# Patient Record
Sex: Female | Born: 1965 | Race: White | Hispanic: No | Marital: Married | Smoking: Current every day smoker
Health system: Southern US, Community
[De-identification: ages and names within clinical notes are randomized; demographics above are authoritative.]

---

## 1999-04-08 ENCOUNTER — Other Ambulatory Visit: Admission: RE | Admit: 1999-04-08 | Discharge: 1999-04-08 | Payer: Self-pay | Admitting: Obstetrics and Gynecology

## 2000-06-02 ENCOUNTER — Other Ambulatory Visit: Admission: RE | Admit: 2000-06-02 | Discharge: 2000-06-02 | Payer: Self-pay | Admitting: Obstetrics and Gynecology

## 2001-07-08 ENCOUNTER — Other Ambulatory Visit: Admission: RE | Admit: 2001-07-08 | Discharge: 2001-07-08 | Payer: Self-pay | Admitting: Obstetrics and Gynecology

## 2005-08-28 ENCOUNTER — Other Ambulatory Visit: Admission: RE | Admit: 2005-08-28 | Discharge: 2005-08-28 | Payer: Self-pay | Admitting: Obstetrics and Gynecology

## 2006-03-06 ENCOUNTER — Inpatient Hospital Stay (HOSPITAL_COMMUNITY): Admission: AD | Admit: 2006-03-06 | Discharge: 2006-03-08 | Payer: Self-pay | Admitting: Obstetrics and Gynecology

## 2006-05-18 ENCOUNTER — Encounter: Admission: RE | Admit: 2006-05-18 | Discharge: 2006-05-18 | Payer: Self-pay | Admitting: Obstetrics and Gynecology

## 2008-08-22 ENCOUNTER — Ambulatory Visit (HOSPITAL_COMMUNITY): Admission: RE | Admit: 2008-08-22 | Discharge: 2008-08-22 | Payer: Self-pay | Admitting: Obstetrics and Gynecology

## 2008-08-22 ENCOUNTER — Encounter (INDEPENDENT_AMBULATORY_CARE_PROVIDER_SITE_OTHER): Payer: Self-pay | Admitting: Obstetrics and Gynecology

## 2011-04-01 NOTE — Op Note (Signed)
Penny Hall, Penny Hall NO.:  [id number]   MEDICAL RECORD NO.:  [id number]          PATIENT TYPE:  AMB   LOCATION:  SDC                           FACILITY:  [facility]   PHYSICIAN:  Juluis Mire, M.D.   DATE OF BIRTH:  04-21-1966   DATE OF PROCEDURE:  08/22/2008  DATE OF DISCHARGE:                               OPERATIVE REPORT   PREOPERATIVE DIAGNOSES:  1. Multiparity, desires sterility.  2. Intrauterine contraceptive device in place.  3. Endometrial cells on Pap smear.   POSTOPERATIVE DIAGNOSES:  1. Multiparity, desires sterility.  2. Intrauterine contraceptive device in place.  3. Endometrial cells on Pap smear.  4. Abdominal omental adhesions.   OPERATIVE PROCEDURE:  1. Removal of IUD.  2. Hysteroscopy with endometrial curettings.  3. Laparoscopy with bilateral tubal ligation and lysis of adhesions.   SURGEON:  Juluis Mire, MD   ANESTHESIA:  General endotracheal.   ESTIMATED BLOOD LOSS:  Minimal.   PACKS AND DRAINS:  None.   INTRAOPERATIVE BLOOD PLACED:  None.   COMPLICATIONS:  None.   INDICATIONS:  As dictated in history and physical.   PROCEDURE IN DETAIL:  The patient was taken to the OR and placed in  supine position.  After satisfactory level of general endotracheal  anesthesia obtained, the patient was placed in dorsal lithotomy position  using Allen stirrups.  The abdomen, perineum, and vagina were prepped  out with Betadine and draped out for hysteroscopy.  A speculum was  placed in the vaginal vault.  The cervix was grasped with single-tooth  tenaculum.  Approximately 10 mL of 1% Nesacaine was instilled into the  cervix for paracervical block.  Using the Randall stone forceps, the  __________ was retrieved intact.  Uterus sounded to 8 cm.  Cervix  serially dilated to size 27 Pratt dilator.  The nonoperative  hysteroscope was introduced.  Intrauterine cavity was distended using  sorbitol.  Visualization revealed absolutely smooth  endometrium.  No  polyps or excrescences.  The hysteroscope was then removed.  Curettings  were obtained and sent for pathological review.  The Hulka tenaculum was  put in place and secured.  The single-tooth tenaculum and speculum were  then removed.  Bladder was emptied by in-and-out catheterization.   At this point in time, a subumbilical incision was made with a knife and  extended through the subcutaneous tissue.  The fascia was identified,  entered sharply, and incision fashioned laterally.  Peritoneum was  entered with blunt pressure.  There was no evidence of any periumbilical  adhesions.  The open laparoscopic trocar was put in place and secured.  The abdomen was insufflated with carbon dioxide.  The laparoscope was  then introduced.  The uterus was elevated.  Uterus, tubes, and ovaries  were unremarkable.  There was no evidence of any pelvic pathology.  Both  tubes were cauterized for a distance of 2.5 cm.  Coagulation was  continued until resistance rate 0 on the meter.  The same segment tube  was then recoagulated completely desiccating the tube.  Coagulation did  extend out to the mesosalpinx.  There was no evidence of injury  to  adjacent organs.  There were omental adhesions to the right lower  quadrant.  These were taken down using scissors and cautery.  I could  not see the appendix.  It may have been retrocecal.  There was no  evidence of any other abdominal pelvic pathology.  We had good  hemostasis.  Both tubes were adequately coagulated.  The abdomen was  deflated of carbon dioxide.  The trocar was removed.  Subumbilical  fascia closed with figure-of-eight of 0 Vicryl, skin with interrupted  subcuticular, then removed.  The patient was taken out of dorsal  lithotomy position.  Once alert and extubated, transferred to the  recovery in good condition.  Sponge, instrument, and needle count was  correct by circulating nurse x2.      Juluis Mire, M.D.   Electronically Signed     JSM/MEDQ  D:  08/22/2008  T:  08/22/2008  Job:  [id number]

## 2011-04-01 NOTE — H&P (Signed)
NAME:  Penny Hall, Penny Hall NO.:  [id number]   MEDICAL RECORD NO.:  [id number]          PATIENT TYPE:  AMB   LOCATION:  SDC                           [facility]ACILITY:  [facility]   PHYSICIAN:  Juluis Mire, M.D.   DATE OF BIRTH:  07-Jul-1966   DATE OF ADMISSION:  08/22/2008  DATE OF DISCHARGE:                              HISTORY & PHYSICAL   The patient is a 45 year old gravida 4, para 3, abortus 1 female  presents for laparoscopic bilateral tubal ligation.  She is also going  to have the IUD removed and a hysteroscopy performed.   In relation to present admission, the patient does have Mirena IUD in  place.  Periods are somewhat irregular, but late.  There is no pain or  discomfort; however, she is interested in permanent sterilization.  Alternatives for birth control obviously have been explained.  The  potential irreversibility of sterilization is explained.  The potential  failure rate of 1 in 2-300s, and quoted figures can be in the form of  ectopic pregnancy requiring further surgical management.   The patient's last Pap smear also revealed endometrial cells on the Pap  smear.  In view of her age, we cannot proceed with hysteroscopic  evaluation after the IUD is removed.   ALLERGIES:  She has no known drug allergies.   MEDICATIONS:  None.   PAST MEDICAL HISTORY:  Usual childhood diseases.  No significant  sequelae.  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   PAST SURGICAL HISTORY:  She had a deviated septum repaired at age 36.  She had an appendectomy at age 27.  She had a laparoscopy in 1996.  She  has had 2 previous cesarean section and 1 vaginal delivery.   SOCIAL HISTORY:  No tobacco or alcohol use.   FAMILY HISTORY:  Unknown as the patient is adopted.   REVIEW OF SYSTEMS:  Noncontributory.   PHYSICAL EXAMINATION:  GENERAL:  The patient is afebrile.  VITAL SIGNS:  Stable.  HEENT:  The patient is normocephalic.  Pupils  are equal, round, and  react to light and accommodation.  Extraocular movements were intact.  Sclerae and conjunctivae are clear.  Oropharynx clear.  NECK:  Without thyromegaly.  BREASTS:  Not examined.  LUNGS:  Clear.  CARDIOVASCULAR:  Regular rhythm rate.  No murmurs or gallops.  ABDOMEN:  Benign.  No mass, organomegaly, or tenderness.  PELVIC:  Normal external genitalia.  Vaginal mucosa clear.  Cervix  unremarkable.  IUD string is not noted.  Uterus normal size, shape, and  contour.  Adnexa free of masses or tenderness.  EXTREMITIES:  Trace edema.  NEUROLOGIC:  Grossly within normal limits.   IMPRESSION:  1. Multiparity, desires sterility.  2. Endometrial cells on Pap smear.  3. Mirena intrauterine device in place.   PLAN:  The patient to undergo removal of IUD as well as hysteroscopy.  The nature of the procedure have been discussed.  The risk would include  a risk of infection.  Risk of vascular injury could lead to hemorrhage  requiring transfusion with the risk of AIDS or  hepatitis.  Excessive  bleeding could require hysterectomy.  There is a risk of perforation  lead to injury to adjacent organs including bowel that could require  further exploratory surgery.  Also, risk of deep venous thrombosis and  pulmonary embolus.  From laparoscopy, the risk included the risk of  infection, risk of vascular injury that could require transfusions, risk  of injury to adjacent organs such as bladder or bowel that could require  further exploratory surgery.  The patient expressed understanding of  indications and risks and accepts none of them.      Juluis Mire, M.D.  Electronically Signed     JSM/MEDQ  D:  08/22/2008  T:  08/22/2008  Job:  [id number]

## 2011-04-04 NOTE — H&P (Signed)
NAME:  Penny Hall, Penny Hall NO.:  [id number]   MEDICAL RECORD NO.:  [id number]          PATIENT TYPE:  INP   LOCATION:                                FACILITY:  [facility]   PHYSICIAN:  Juluis Mire, M.D.   DATE OF BIRTH:  1966-05-20   DATE OF ADMISSION:  03/06/2006  DATE OF DISCHARGE:                                HISTORY & PHYSICAL   HISTORY OF PRESENT ILLNESS:  The patient is a 45 year old gravida 4, para 1-  1-1-2 married female whose estimated date of confinement is March 15, 2006,  giving her an estimated gestational age of [redacted] weeks.  She presents for  repeat cesarean section.   In relation to present admission, the patient's last pregnancy was a low  transverse cesarean section for failure to progress.  The patient declined a  trial of labor and presents for repeat cesarean section.   The patient was at risk for advanced maternal age.  We had discussed the  increased risk of genetic anomalies associated with advanced maternal age.  With her first pregnancy, her son did have Down's syndrome with preterm  delivery at 34 weeks.  The patient declined any type of genetic evaluation,  stating she would not terminate the pregnancy based on any findings.  Ultrasounds were basically unremarkable.   ALLERGIES:  The patient has no known drug allergies.   MEDICATIONS:  Prenatal vitamins.   PAST MEDICAL HISTORY:  Please see prenatal records.   FAMILY HISTORY:  Please see prenatal records.   SOCIAL HISTORY:  Please see prenatal records.   REVIEW OF SYSTEMS:  Noncontributory.   PHYSICAL EXAMINATION:  VITAL SIGNS:  The patient is afebrile with stable  vital signs.  HEENT:  The patient is normocephalic.  Pupils equal, round and reactive to  light and accommodation.  Extraocular movements were intact.  Sclerae and  conjunctivae were clear.  Oropharynx clear.  NECK:  Without thyromegaly.  BREASTS:  Glandular, but no discrete masses.  LUNGS:  Clear.  CARDIOVASCULAR:   Regular rate and rhythm with a grade 2/6 systolic ejection  murmur, no clicks or gallops.  ABDOMEN:  Gravid uterus consistent with dates.  PELVIC:  Exam deferred due to planned C-section.  EXTREMITIES:  Trace edema.  NEUROLOGIC:  Exam is grossly within normal limits.   IMPRESSION:  1.  Intrauterine pregnancy at term with prior cesarean section, desires      repeat.  2.  Advanced maternal age.  3.  Prior history of child with trisomy 22.   PLAN:  The patient will undergo repeat cesarean section.  The risks of the  procedure have been discussed including the risk of infection, the risk of  hemorrhage that could require transfusion with the risks of AIDS or  hepatitis, the risk of injury to adjacent organs including bladder, bowel or  ureters that would require further exploratory surgery, risks of deep venous  thrombosis and pulmonary embolus.  The patient expressed understanding of  indications and risks and was accepting of them.      Juluis Mire, M.D.  Electronically Signed  JSM/MEDQ  D:  03/06/2006  T:  03/06/2006  Job:  [id number]

## 2011-04-04 NOTE — Discharge Summary (Signed)
Penny Hall, Penny Hall        ACCOUNT NO.:  [id number]   MEDICAL RECORD NO.:  [id number]          PATIENT TYPE:  INP   LOCATION:  9139                          FACILITY:  [facility]   PHYSICIAN:  Michelle L. Grewal, M.D.DATE OF BIRTH:  1966-05-06   DATE OF ADMISSION:  03/06/2006  DATE OF DISCHARGE:  03/08/2006                                 DISCHARGE SUMMARY   ADMITTING DIAGNOSES:  1.  Intrauterine pregnancy at term.  2.  Previous cesarean section, desires repeat.   DISCHARGE DIAGNOSES:  1.  Status post low transverse cesarean section.  2.  Viable female infant.   PROCEDURE:  Repeat low transverse cesarean section.   REASON FOR ADMISSION:  Please see dictated H&P.   HOSPITAL COURSE:  The patient is a 45 year old gravida 4, para 2 white  married female, that was admitted to [facility] for  scheduled cesarean section.  The patient had a previous cesarean for failure  to progress and desired repeat.  On the morning of admission, the patient  was taken to the operating room, where spinal anesthesia was administered  without difficulty.  A low transverse incision was made, with delivery of a  viable female infant weighing 8 pounds 11 ounces and Apgars of 9 at 1 min and  9 at 5 min.  The patient tolerated the procedure well and was taken to the  recovery room in stable condition.   On postoperative day #1 the patient was without complaint.  She was  afebrile.  Vital signs were stable.  Abdomen soft, with good return of bowel  function.  Fundus was firm and nontender.  Abdominal dressing was found to  be clean, dry and intact.  Laboratory findings revealed hemoglobin of 9.7,  platelet count 314,000, WBC 9.2.   On postoperative day #2 the patient was without complaint.  She did desire  early discharge.  Vital signs were stable.  She was afebrile.  Abdomen soft.  Fundus firm and nontender.  Incision was clean, dry and intact.  Staples  were removed and the patient was  discharged home.   CONDITION ON DISCHARGE:  Good.   DIET:  Regular as tolerated.   ACTIVITY:  No heavy lifting, no driving x2 weeks.  No vaginal entry.   FOLLOW UP:  Patient to follow up in the office in 1-2 weeks for incision  check.  She is to call for temperature greater than 100 degrees, persistent  nausea or vomiting, heavy vaginal bleeding and/or redness or drainage from  the incisional site.   DISCHARGE MEDICATIONS:  1.  Percocet 5/325 mg (31) p.o. q.4-6 h. p.r.n.  2.  Motrin 600 mg q.6 h.  3.  Prenatal vitamins one p.o. daily.  4.  Colace one p.o. daily.      Julio Sicks, N.P.      Stann Mainland. Vincente Poli, M.D.  Electronically Signed    CC/MEDQ  D:  03/26/2006  T:  03/27/2006  Job:  [id number]

## 2011-04-04 NOTE — Op Note (Signed)
[name], Penny Hall NO.:  [id number]   MEDICAL RECORD NO.:  [id number]          PATIENT TYPE:  INP   LOCATION:  9139                          FACILITY:  [facility]   PHYSICIAN:  Juluis Mire, M.D.   DATE OF BIRTH:  [birth year]   DATE OF PROCEDURE:  03/06/2006  DATE OF DISCHARGE:                                 OPERATIVE REPORT   PREOPERATIVE DIAGNOSIS:  Intrauterine pregnancy at term with prior cesarean  section, __________.   POSTOPERATIVE DIAGNOSIS:  Intrauterine pregnancy at term with prior cesarean  section,__________.   OPERATIVE PROCEDURE:  Low transverse cesarean section.   SURGEON:  Juluis Mire, M.D.   ANESTHESIA:  Spinal.   ESTIMATED BLOOD LOSS:  500-600 cc.   PACKS/DRAINS:  None.   __________ .   COMPLICATIONS:  None.   INDICATIONS:  Dictated in the history and physical.   PROCEDURE:  Patient was taken to the OR.  Placed in a supine position with  left lateral tilt.  After a satisfactory level of spinal anesthesia was  obtained, the abdomen was prepped with Betadine and draped in a sterile  field.  The previous low transverse cesarean incision was identified and  excised.  The incision was extended through the subcutaneous tissue.  The  fascia was entered sharply, and __________ .  The fascia was taken off the  muscles superiorly and inferiorly.  Rectus muscles were separated in the  midline.  The peritoneum was entered sharply, and the incision was extended,  both superiorly and inferiorly.  A low transverse bladder flap was  developed.  A low transverse uterine incision was begun with the knife and  extended laterally using manual traction.  Amniotic fluid was clear.  The  infant presented with elevation of the head and fundal pressure.  The infant  was a viable female weighing 8 pounds, 11 ounces.  Apgars were 9/9.  There  were some large bleeding sinuses encountered during the uterine incision.  We exteriorized the uterus and brought these  under control with the ring  forceps.  The placenta was then delivered manually.  Tubes and ovaries were  unremarkable.  The uterus was closed with running, locking sutures of 0  chromic in a two-layered closure technique.  We had good hemostasis at this  point.  Urine output remained clear and adequate.  The uterus was returned  to the abdominal cavity.  Muscles were approximated with a  running suture of 3-0 Vicryl.  Fascia was closed with a running suture of 0  PDS.  The skin was closed with staple and Steri-Strips.  Sponge, needle, and  instrument count correct by the circulating nurse x2.  Urine output was  clear at the time of closure.  The patient tolerated the procedure well and  returned to the recovery room in good condition.      Juluis Mire, M.D.  Electronically Signed     JSM/MEDQ  D:  03/06/2006  T:  03/06/2006  Job:  [id number]

## 2011-08-18 LAB — CBC
HCT: 41.9
Hemoglobin: 13.6
MCHC: 32.5
MCV: 96.8
Platelets: 451 — ABNORMAL HIGH
RBC: 4.32
RDW: 12.6

## 2014-09-20 ENCOUNTER — Other Ambulatory Visit: Payer: Self-pay | Admitting: Family Medicine

## 2014-09-20 ENCOUNTER — Other Ambulatory Visit (HOSPITAL_COMMUNITY)
Admission: RE | Admit: 2014-09-20 | Discharge: 2014-09-20 | Disposition: A | Discharge status: Home / Self Care | Payer: Medicaid Other | Source: Ambulatory Visit | Attending: Family Medicine | Admitting: Family Medicine

## 2014-09-20 ENCOUNTER — Other Ambulatory Visit (HOSPITAL_COMMUNITY): Payer: Self-pay | Admitting: Family Medicine

## 2014-09-20 DIAGNOSIS — Z01419 Encounter for gynecological examination (general) (routine) without abnormal findings: Secondary | ICD-10-CM | POA: Insufficient documentation

## 2014-09-20 DIAGNOSIS — Z1231 Encounter for screening mammogram for malignant neoplasm of breast: Secondary | ICD-10-CM

## 2014-09-22 LAB — CYTOLOGY - PAP

## 2014-10-04 ENCOUNTER — Ambulatory Visit (HOSPITAL_COMMUNITY)
Admission: RE | Admit: 2014-10-04 | Discharge: 2014-10-04 | Disposition: A | Discharge status: Home / Self Care | Payer: Medicaid Other | Source: Ambulatory Visit | Attending: Family Medicine | Admitting: Family Medicine

## 2014-10-04 DIAGNOSIS — Z1231 Encounter for screening mammogram for malignant neoplasm of breast: Secondary | ICD-10-CM

## 2015-06-13 ENCOUNTER — Other Ambulatory Visit: Payer: Self-pay | Admitting: Family Medicine

## 2015-06-13 DIAGNOSIS — R1030 Lower abdominal pain, unspecified: Secondary | ICD-10-CM

## 2015-06-21 ENCOUNTER — Ambulatory Visit
Admission: RE | Admit: 2015-06-21 | Discharge: 2015-06-21 | Disposition: A | Discharge status: Home / Self Care | Payer: Medicaid Other | Source: Ambulatory Visit | Attending: Family Medicine | Admitting: Family Medicine

## 2015-06-21 DIAGNOSIS — R1030 Lower abdominal pain, unspecified: Secondary | ICD-10-CM

## 2015-06-21 IMAGING — US US PELVIS COMPLETE
1 series · 14 of 25 positions shown · non-contrast
Comparison: None

CLINICAL DATA: Right lower quadrant pain for 1 month. Dyspareunia.
Postmenopausal female.

EXAM:
TRANSABDOMINAL AND TRANSVAGINAL ULTRASOUND OF PELVIS
TECHNIQUE: Both transabdominal and transvaginal ultrasound examinations of the
pelvis were performed. Transabdominal technique was performed for
global imaging of the pelvis including uterus, ovaries, adnexal
regions, and pelvic cul-de-sac. It was necessary to proceed with
endovaginal exam following the transabdominal exam to visualize the
endometrial stripe and ovaries.

[Series 1: us pelvis complete · 0.28mm/px · 14 of 40 slices shown]
[im 1/40]
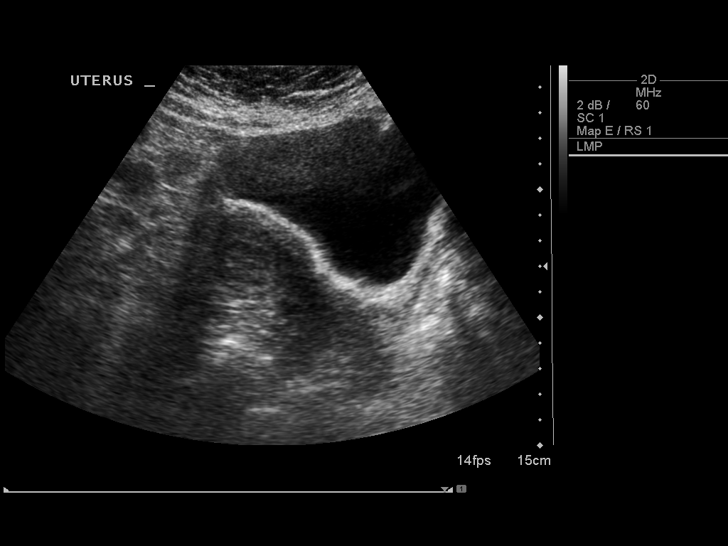
[im 4/40]
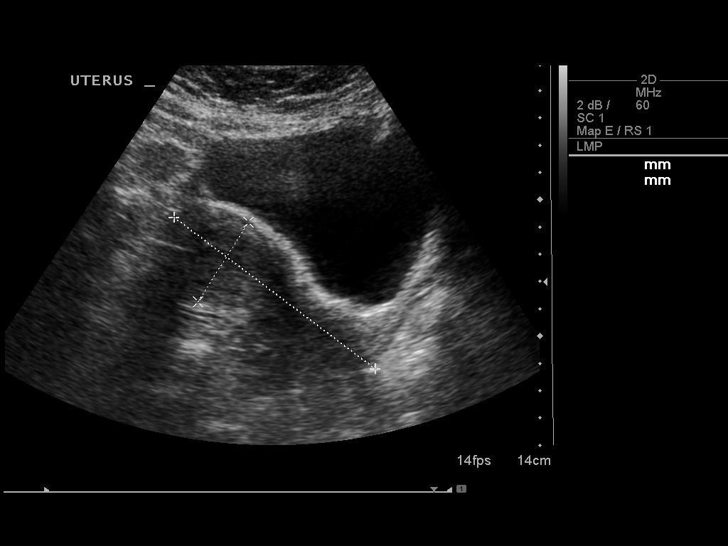
[im 7/40]
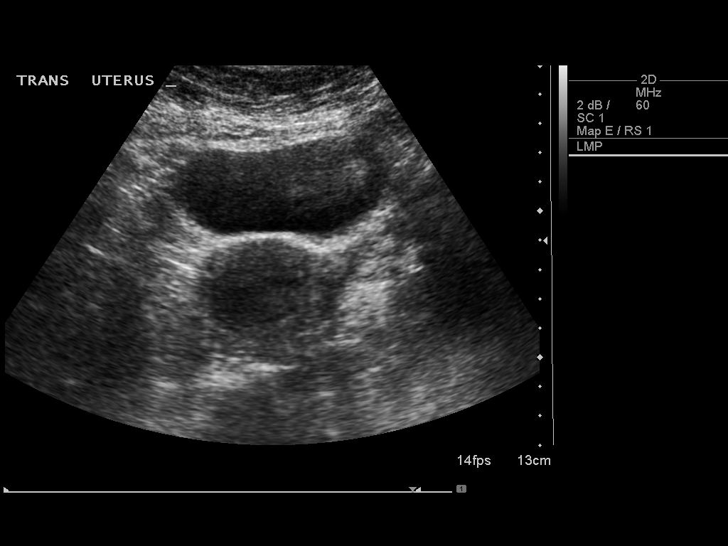
[im 10/40]
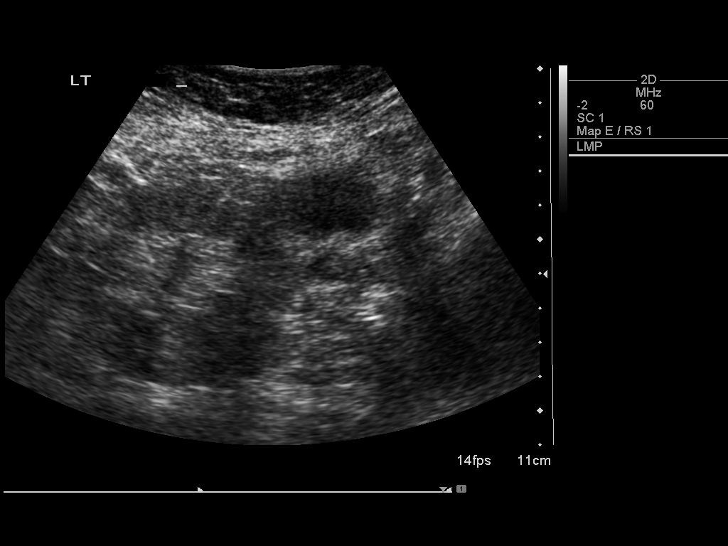
[im 14/40]
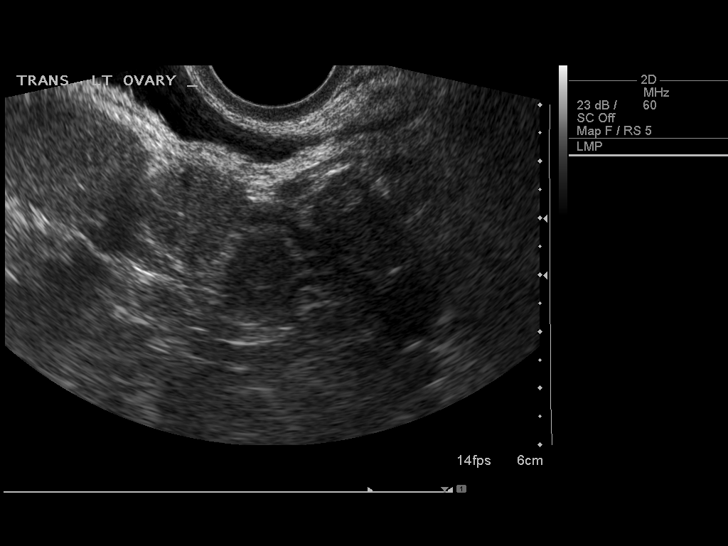
[im 15/40]
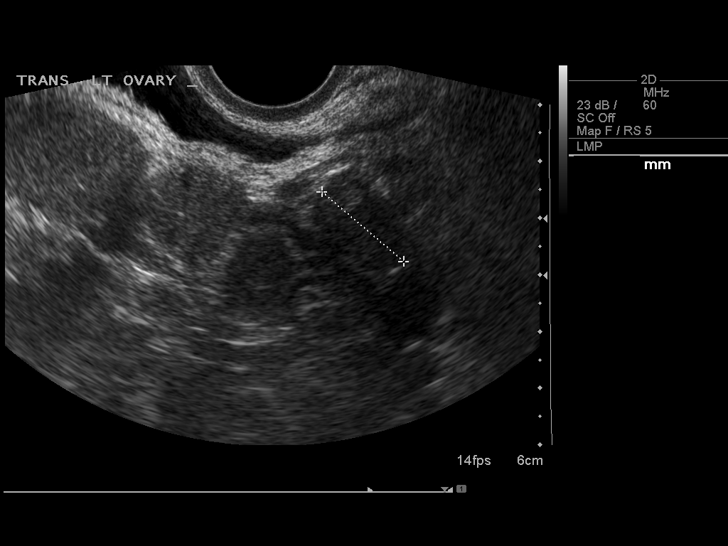
[im 18/40]
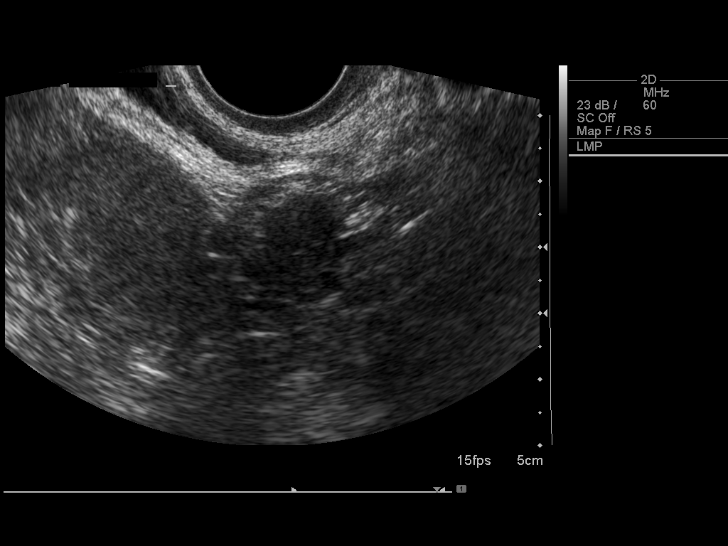
[im 22/40]
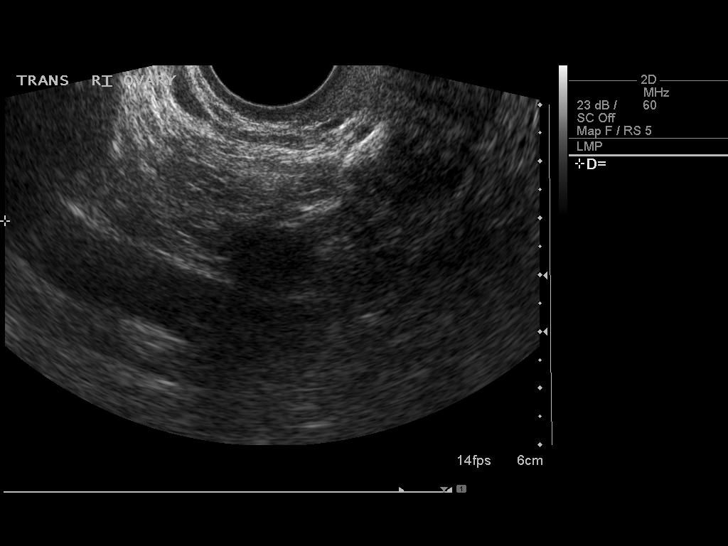
[im 25/40]
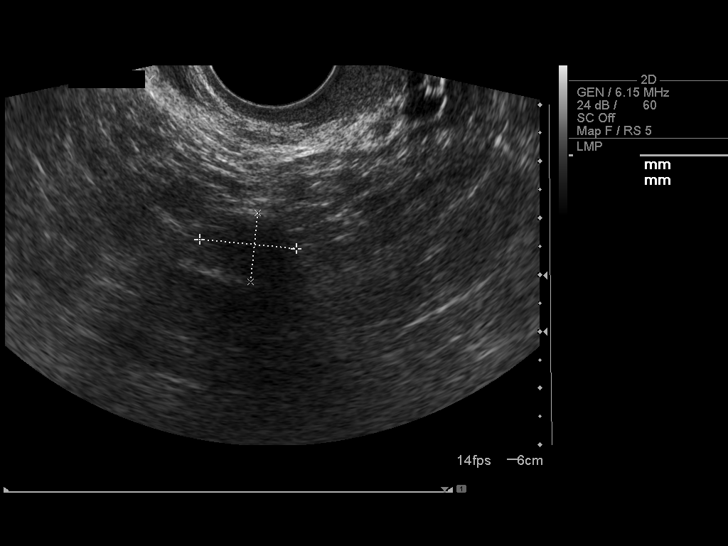
[im 27/40]
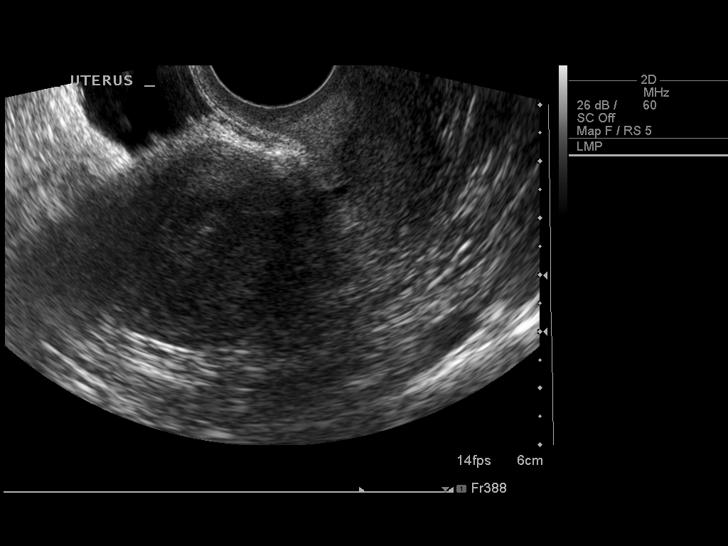
[im 30/40]
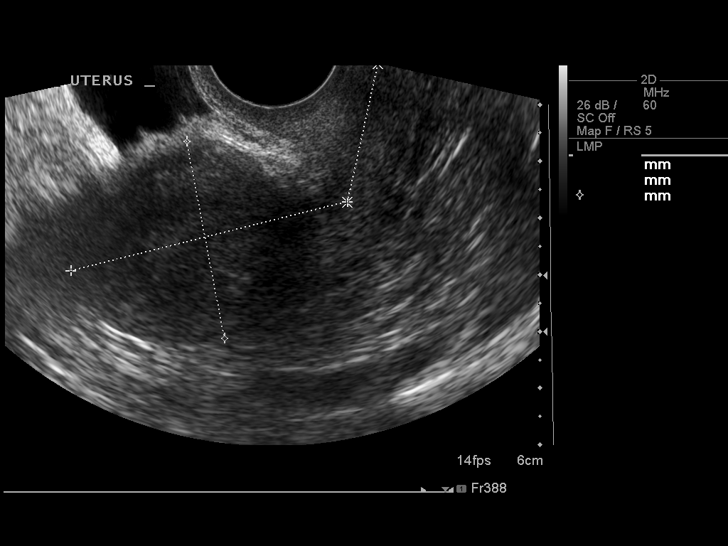
[im 33/40]
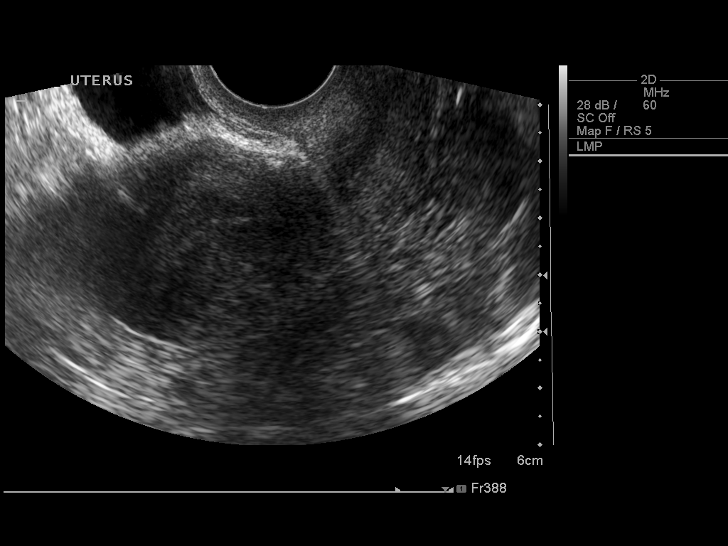
[im 36/40]
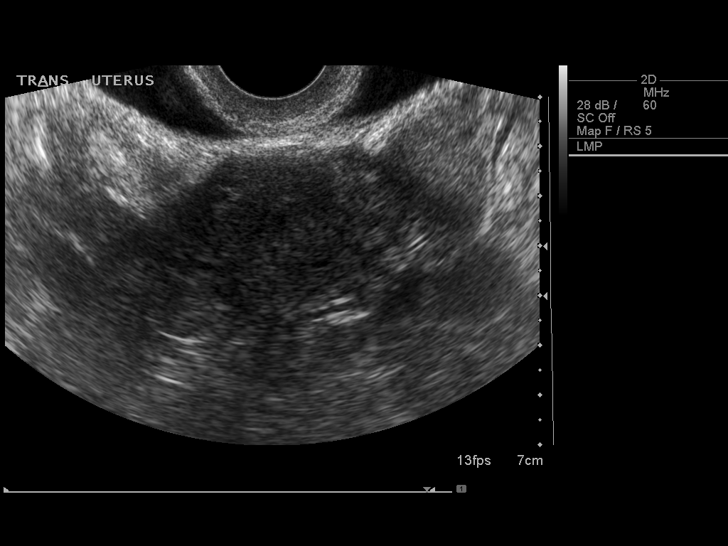
[im 40/40]
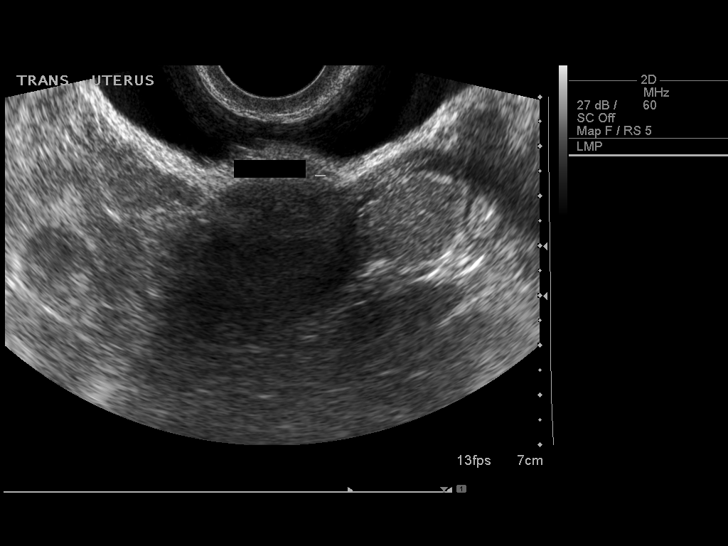

[14 of 25 positions shown; findings below may reference images not displayed]

FINDINGS: Uterus

Measurements: 9.2 x 3.5 x 5.2 cm. No fibroids or other mass
visualized.

Endometrium

Thickness: 6 mm.  No focal abnormality visualized.

Right ovary

Measurements: 1.7 x 1.2 x 1.8 cm. Normal appearance/no adnexal mass.

Left ovary

Measurements: 1.3 x 1.6 x 1.9 cm. Normal appearance/no adnexal mass.

Other findings

No free fluid.
IMPRESSION: Normal appearance of uterus and both ovaries. No pelvic mass or
sonographic abnormality visualized.

## 2015-11-30 ENCOUNTER — Other Ambulatory Visit (HOSPITAL_COMMUNITY)
Admission: RE | Admit: 2015-11-30 | Discharge: 2015-11-30 | Disposition: A | Discharge status: Home / Self Care | Payer: Medicaid Other | Source: Ambulatory Visit | Attending: Family Medicine | Admitting: Family Medicine

## 2015-11-30 ENCOUNTER — Other Ambulatory Visit: Payer: Self-pay | Admitting: Family Medicine

## 2015-11-30 DIAGNOSIS — Z01419 Encounter for gynecological examination (general) (routine) without abnormal findings: Secondary | ICD-10-CM | POA: Diagnosis not present

## 2015-12-04 LAB — CYTOLOGY - PAP

## 2018-05-21 ENCOUNTER — Other Ambulatory Visit (HOSPITAL_COMMUNITY): Payer: Self-pay | Admitting: Obstetrics and Gynecology

## 2018-05-21 DIAGNOSIS — R1013 Epigastric pain: Secondary | ICD-10-CM

## 2018-05-26 ENCOUNTER — Ambulatory Visit (HOSPITAL_COMMUNITY): Payer: BLUE CROSS/BLUE SHIELD

## 2018-05-26 ENCOUNTER — Encounter (HOSPITAL_COMMUNITY): Payer: Self-pay

## 2022-01-01 ENCOUNTER — Other Ambulatory Visit: Payer: Self-pay | Admitting: Obstetrics and Gynecology

## 2022-01-01 DIAGNOSIS — R1010 Upper abdominal pain, unspecified: Secondary | ICD-10-CM

## 2022-01-08 ENCOUNTER — Other Ambulatory Visit: Payer: Self-pay | Admitting: Obstetrics and Gynecology

## 2022-01-08 DIAGNOSIS — R1010 Upper abdominal pain, unspecified: Secondary | ICD-10-CM

## 2022-01-18 ENCOUNTER — Ambulatory Visit
Admission: RE | Admit: 2022-01-18 | Discharge: 2022-01-18 | Disposition: A | Discharge status: Home / Self Care | Payer: Managed Care, Other (non HMO) | Source: Ambulatory Visit | Attending: Obstetrics and Gynecology | Admitting: Obstetrics and Gynecology

## 2022-01-18 ENCOUNTER — Other Ambulatory Visit: Payer: Self-pay

## 2022-01-18 DIAGNOSIS — R1010 Upper abdominal pain, unspecified: Secondary | ICD-10-CM

## 2022-01-18 IMAGING — MR MR ABDOMEN WO/W CM
11 of 17 series · 29 of 48 positions shown · IV contrast (14ml multihance)
Comparison: Pelvic ultrasound [DATE]

CLINICAL DATA: Worsening chronic left upper quadrant abdominal and
pelvic pain. History of endometriosis

EXAM:
MRI ABDOMEN AND PELVIS WITHOUT AND WITH CONTRAST
TECHNIQUE: Multiplanar multisequence MR imaging of the abdomen and pelvis was
performed both before and after the administration of intravenous
contrast.
CONTRAST:  13mL MULTIHANCE GADOBENATE DIMEGLUMINE 529 MG/ML IV SOLN

[Series 2: cor haste · coronal · 4.5mm · 0.74mm/px · 3 of 38 slices shown]
[im 1/38]
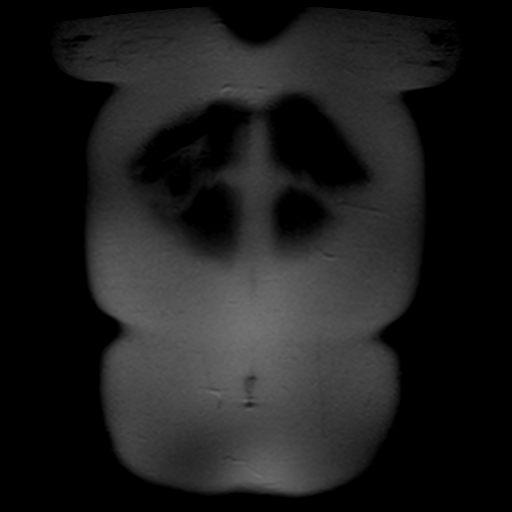
[im 19/38]
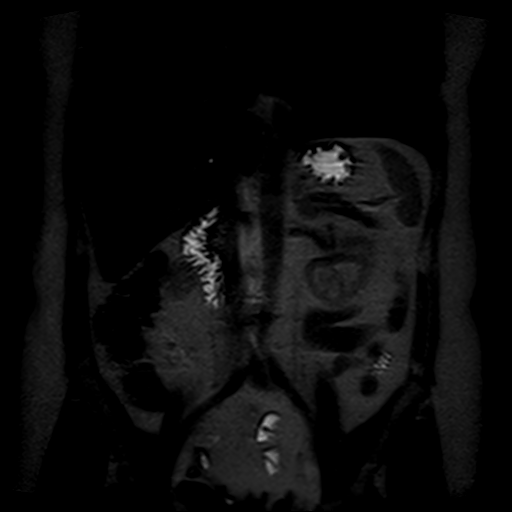
[im 38/38]
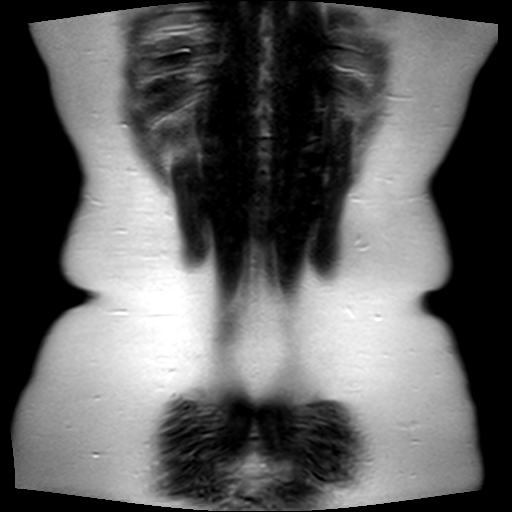

[Series 4: T1 · axial · 6.0mm · 0.74mm/px · z∈[+140,+344]mm · 4 of 64 slices shown]
[im 1/64]
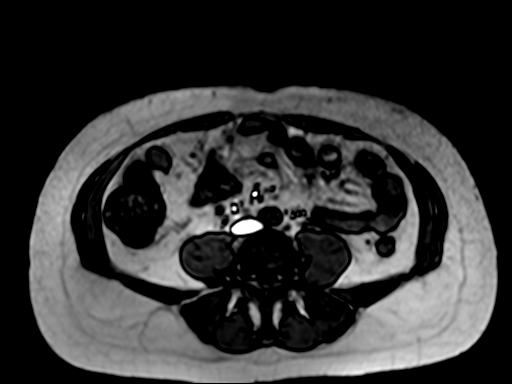
[im 22/64]
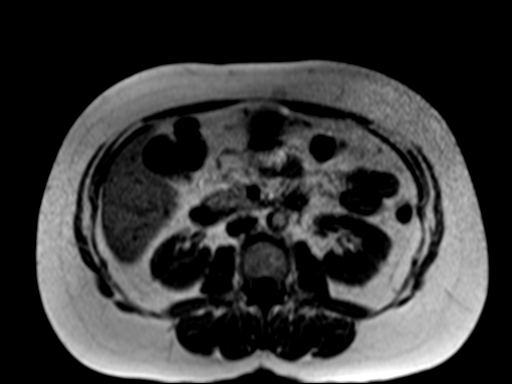
[im 43/64]
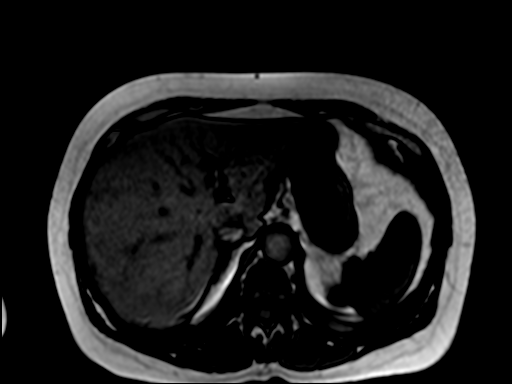
[im 64/64]
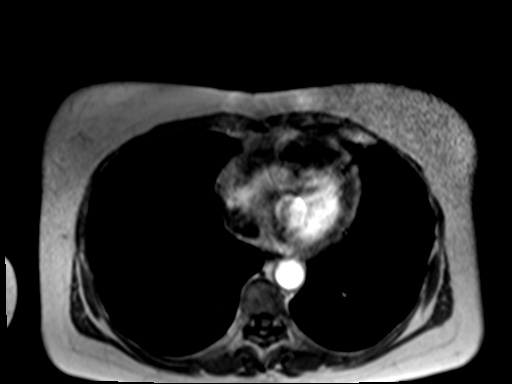

[Series 5: bSSFP · axial · 6.0mm · 0.74mm/px · 1 of 30 slices shown]
[im 1/30]
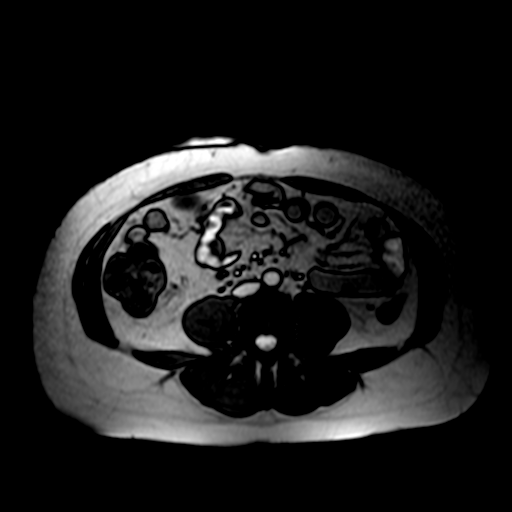

[Series 6: axial haste · axial · 6.0mm · 0.74mm/px · 1 of 30 slices shown]
[im 1/30]
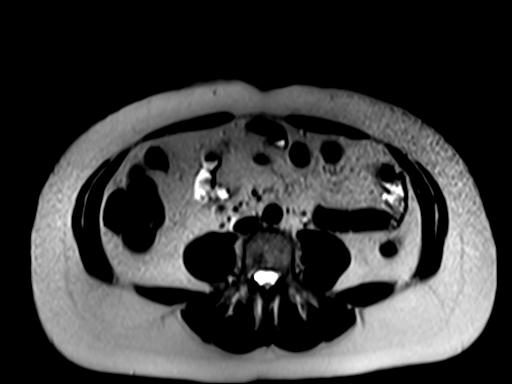

[Series 7: T2 · axial · 6.0mm · 1.12mm/px · z∈[+149,+380]mm · 2 of 36 slices shown]
[im 1/36]
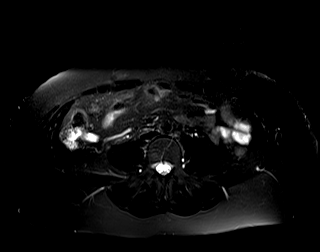
[im 36/36]
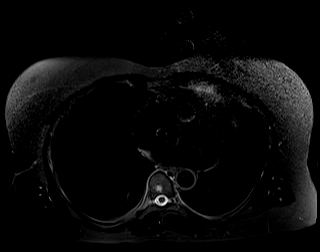

[Series 8: ep2d_diff_b50_500_800_p2_trig · axial · 6.0mm · 1.98mm/px · z∈[+147,+378]mm · 5 of 108 slices shown]
[im 1/108]
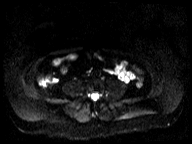
[im 27/108]
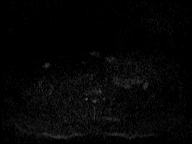
[im 54/108]
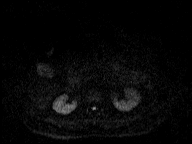
[im 81/108]
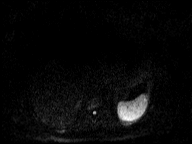
[im 108/108]
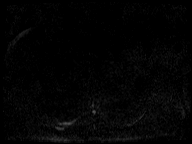

[Series 9: ep2d_diff_b50_500_800_p2_trig_adc · axial · 6.0mm · 1.98mm/px · z∈[+147,+378]mm · 2 of 36 slices shown]
[im 1/36]
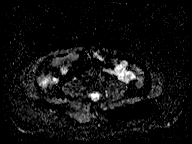
[im 36/36]
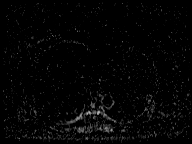

[Series 10: T1 dynamic · axial · non-contrast · 2.5mm · 0.74mm/px · z∈[+147,+344]mm · 3 of 80 slices shown]
[im 1/80]
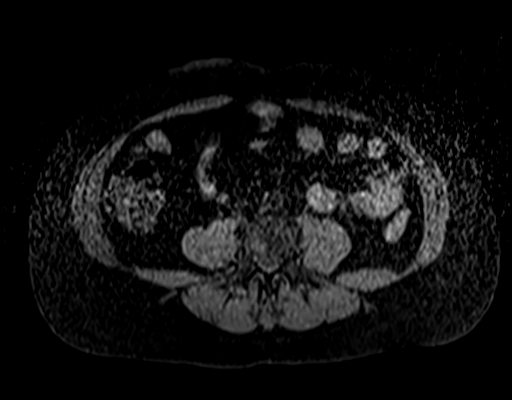
[im 40/80]
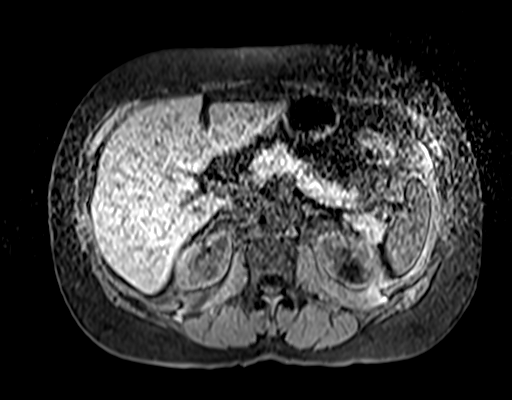
[im 80/80]
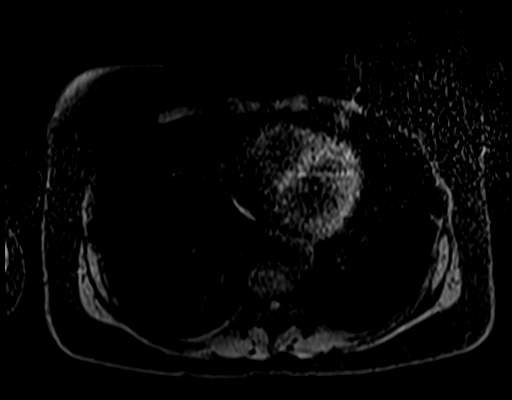

[Series 11: T1 dynamic post-contrast · axial · 2.5mm · 0.74mm/px · z∈[+147,+344]mm · 3 of 80 slices shown (1 of 3)]
[im 1/80]
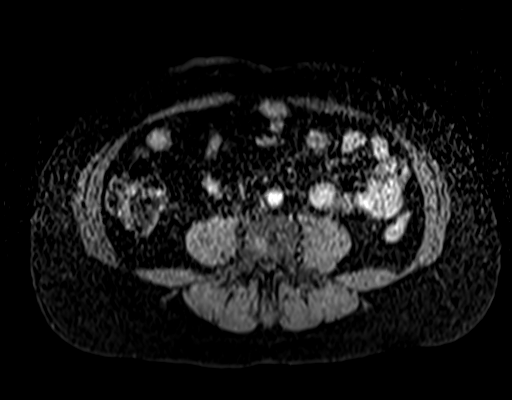
[im 40/80]
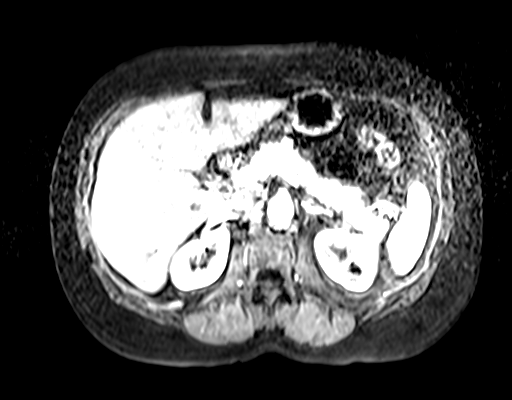
[im 80/80]
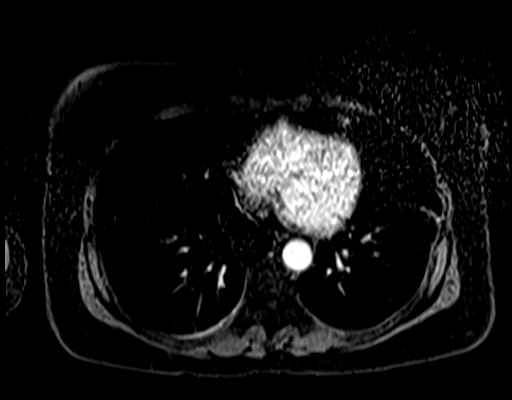

[Series 12: T1 dynamic post-contrast · axial · 2.5mm · 0.74mm/px · z∈[+147,+344]mm · 3 of 80 slices shown (2 of 3)]
[im 1/80]
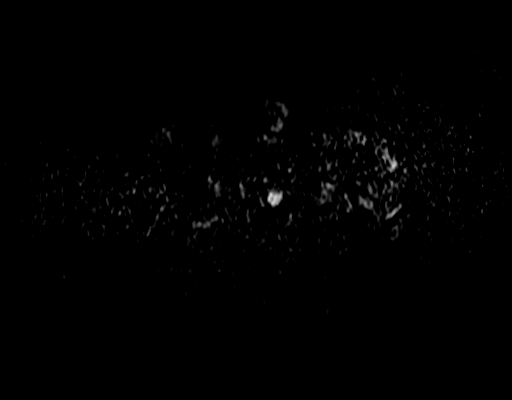
[im 40/80]
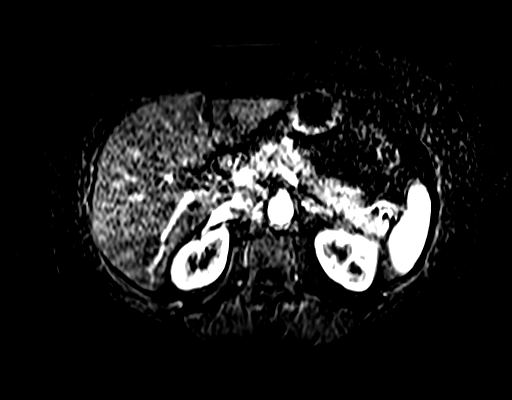
[im 80/80]
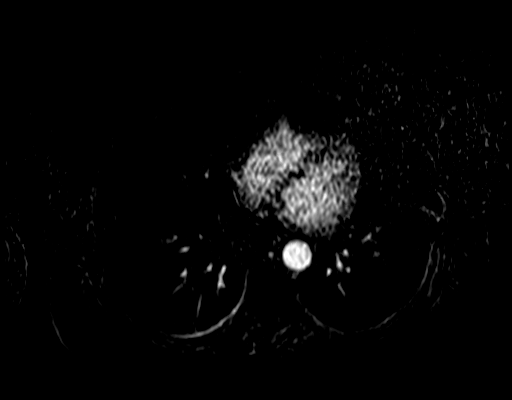

[Series 13: T1 dynamic post-contrast · axial · 2.5mm · 0.74mm/px · z∈[+147,+244]mm · 2 of 80 slices shown (3 of 3)]
[im 1/80]
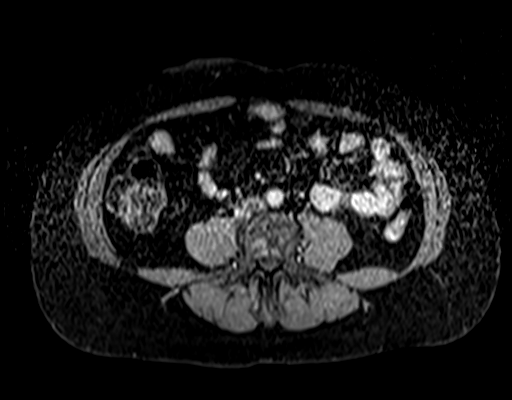
[im 40/80]
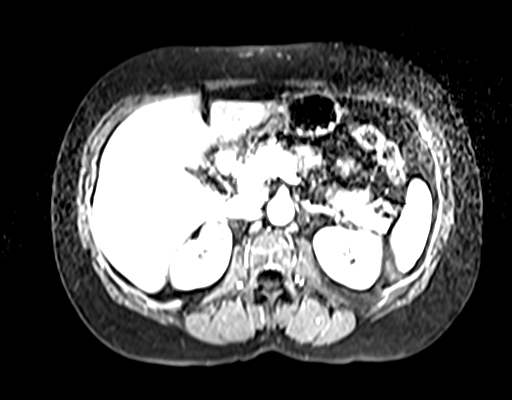

[29 of 48 positions shown; findings below may reference images not displayed]

FINDINGS: COMBINED FINDINGS FOR BOTH MR ABDOMEN AND PELVIS

Lower chest: No acute abnormality.

Hepatobiliary: Cysts in the left lobe of the liver. No solid
enhancing hepatic lesion. No significant hepatic steatosis.
Cholelithiasis without findings of acute cholecystitis. No biliary
ductal dilation.

Pancreas: Intrinsic T1 signal of the pancreatic parenchyma is within
normal limits. Homogeneous enhancement of the pancreas postcontrast
administration. No pancreatic ductal dilation. No cystic or solid
hyperenhancing pancreatic lesion identified.

Spleen:  No splenomegaly or focal splenic lesion.

Adrenals/Urinary Tract: Bilateral adrenal glands appear normal. No
hydronephrosis. 2.6 cm left upper pole renal cyst. No solid
enhancing renal mass.

Stomach/Bowel: Stomach is unremarkable for degree of distension. No
pathologic dilation of small or large bowel. The terminal ileum
appears normal. Appendix is not confidently identified however there
is no pericecal inflammation. No evidence of acute bowel
inflammation. Colonic diverticulosis without findings of acute
diverticulitis.

Vascular/Lymphatic:  No abdominal aortic aneurysm.

Reproductive: Uterus and bilateral adnexa are unremarkable. No
abnormal intrinsically T1 hyperintense foci visualized in the pelvis
to suggest endometrial implants.

Other:  No significant abdominopelvic free fluid.

Musculoskeletal: Sacral perineural root sleeve cysts. No suspicious
osseous lesion.
IMPRESSION: 1. No acute abnormality visualized within the abdomen or pelvis.
2. Colonic diverticulosis without findings of acute diverticulitis.
3. Benign 2.6 cm left upper pole renal cyst.
4. Nonenhancing cysts in the left lobe of the liver.
5. Cholelithiasis without findings of acute cholecystitis.

## 2022-01-18 IMAGING — MR MR PELVIS WO/W CM
4 of 6 series · 34 of 48 positions shown · IV contrast (multihance)
Comparison: Pelvic ultrasound [DATE]

CLINICAL DATA: Worsening chronic left upper quadrant abdominal and
pelvic pain. History of endometriosis

EXAM:
MRI ABDOMEN AND PELVIS WITHOUT AND WITH CONTRAST
TECHNIQUE: Multiplanar multisequence MR imaging of the abdomen and pelvis was
performed both before and after the administration of intravenous
contrast.
CONTRAST:  13mL MULTIHANCE GADOBENATE DIMEGLUMINE 529 MG/ML IV SOLN

[Series 3: cor haste · coronal · 4.5mm · 0.74mm/px · 11 of 36 slices shown]
[im 1/36]
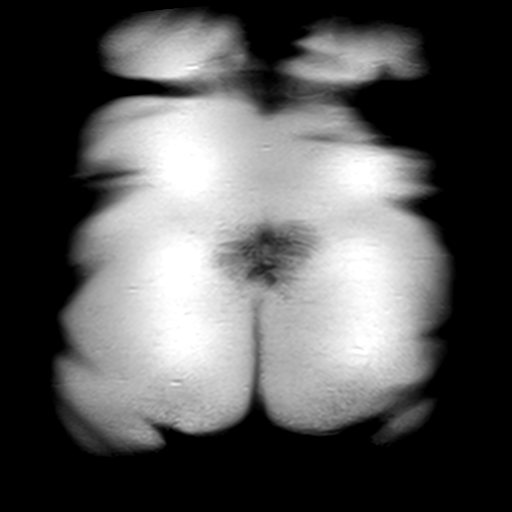
[im 4/36]
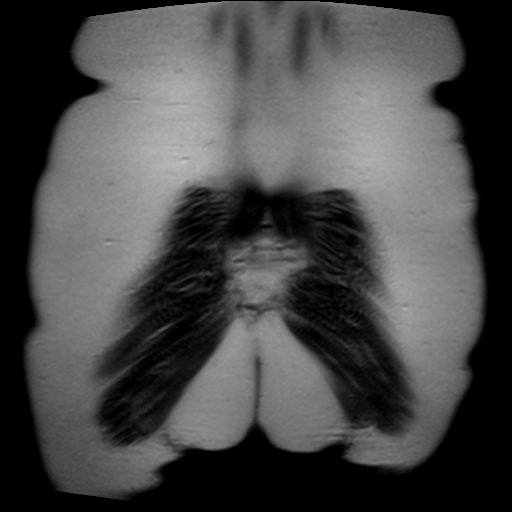
[im 8/36]
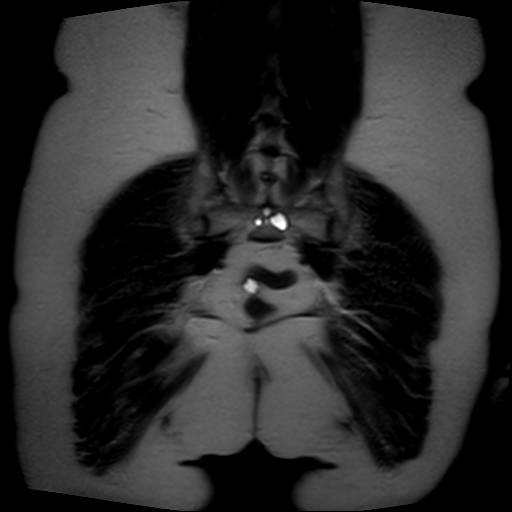
[im 11/36]
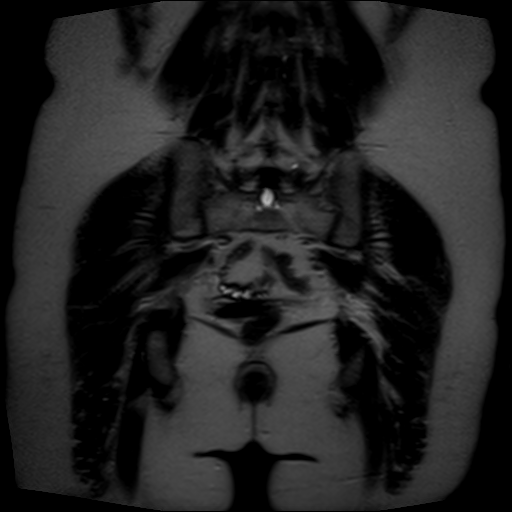
[im 15/36]
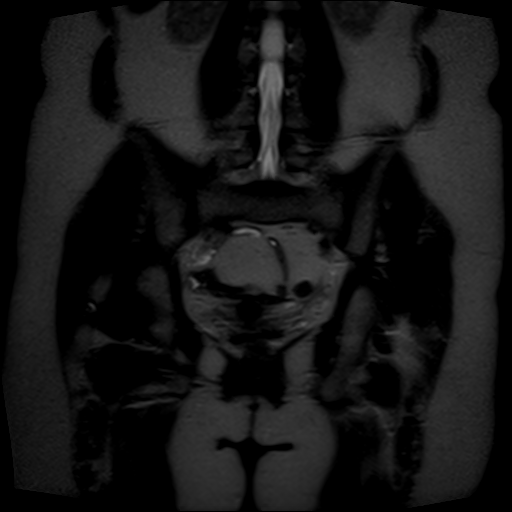
[im 18/36]
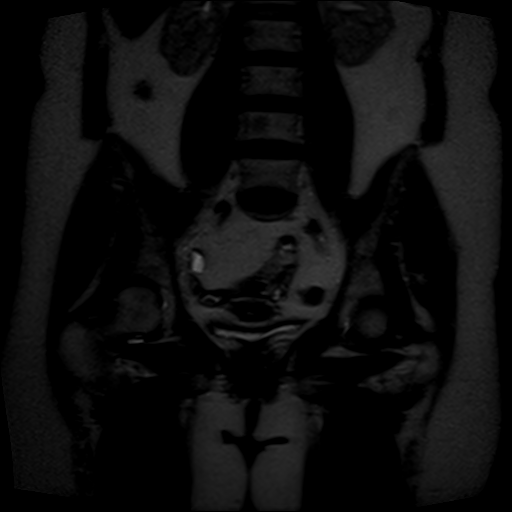
[im 22/36]
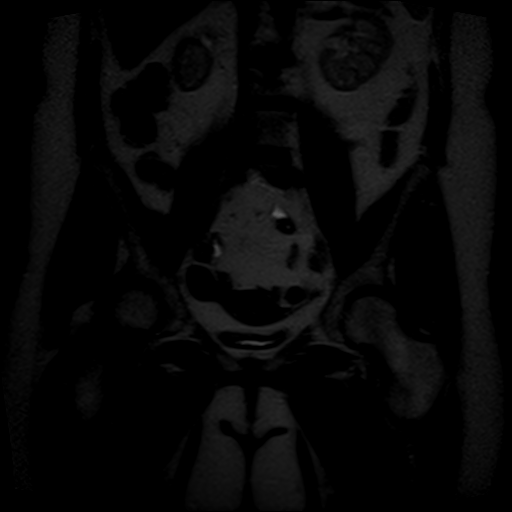
[im 25/36]
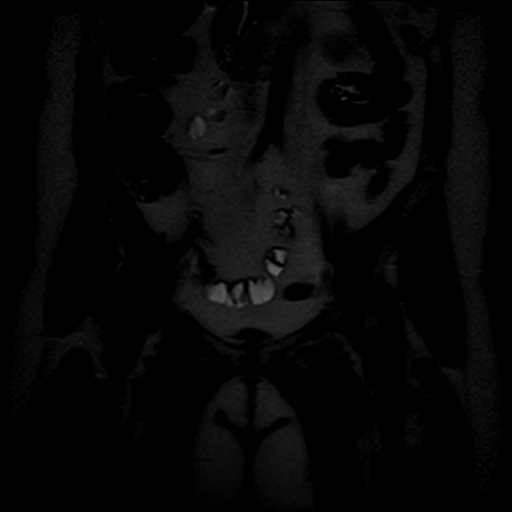
[im 29/36]
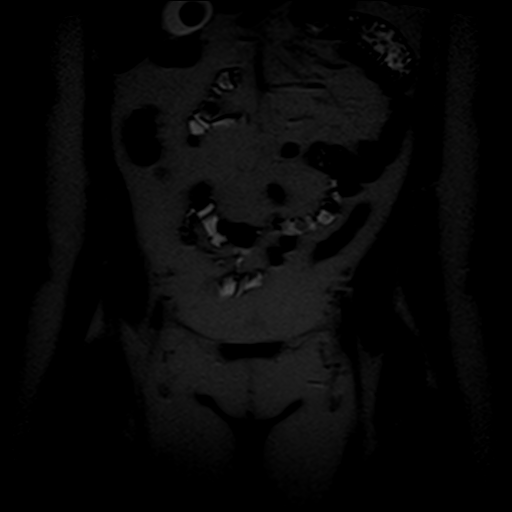
[im 32/36]
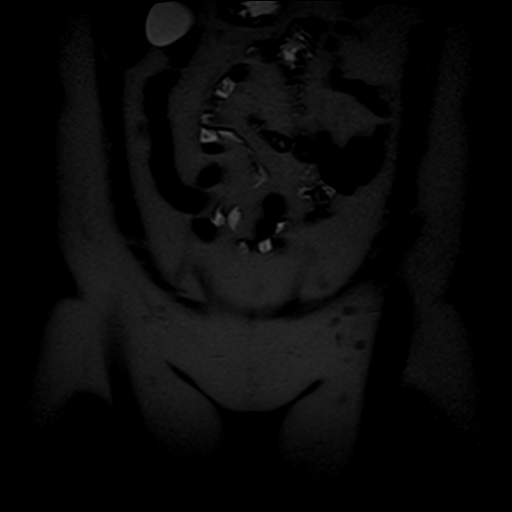
[im 36/36]
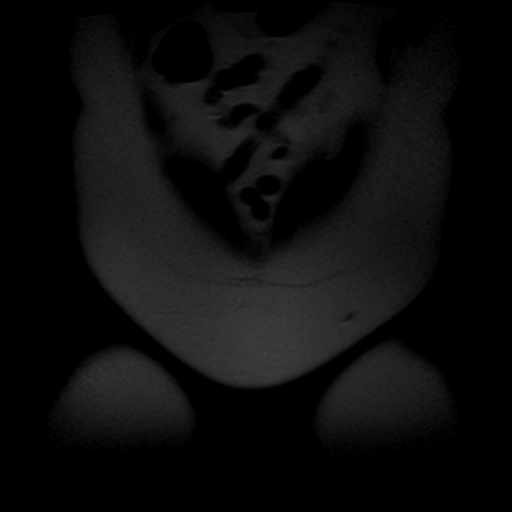

[Series 4: t2_tse_sag · sagittal · 5.0mm · 0.98mm/px · 9 of 30 slices shown]
[im 1/30]
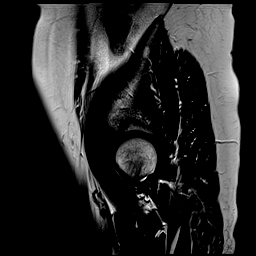
[im 4/30]
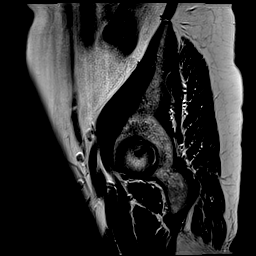
[im 8/30]
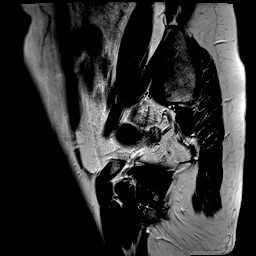
[im 11/30]
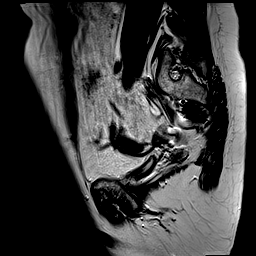
[im 15/30]
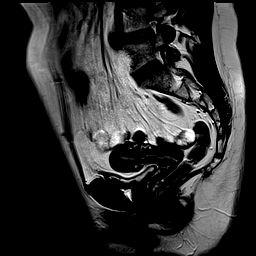
[im 19/30]
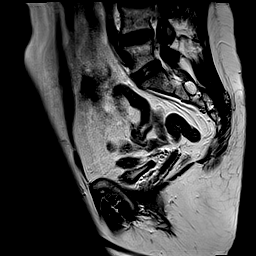
[im 22/30]
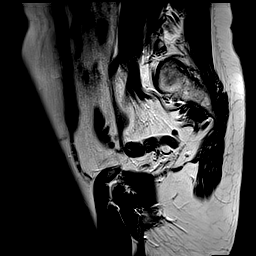
[im 26/30]
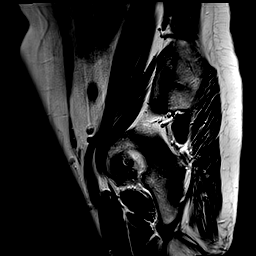
[im 30/30]
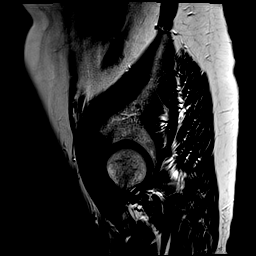

[Series 5: t2_tse axial · axial · 6.0mm · 0.98mm/px · z∈[-53,+142]mm · 7 of 26 slices shown]
[im 1/26]
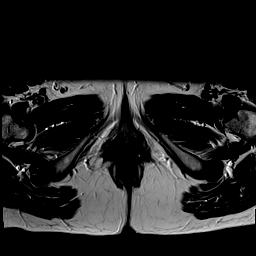
[im 5/26]
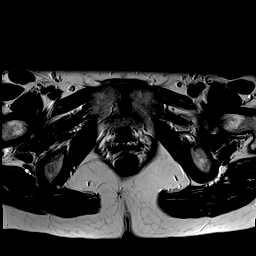
[im 9/26]
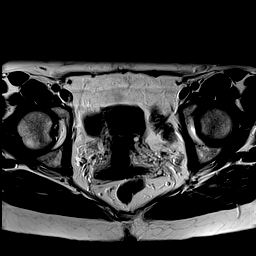
[im 13/26]
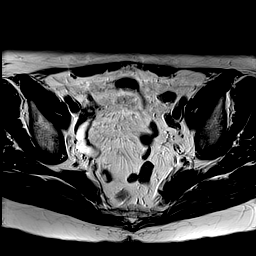
[im 17/26]
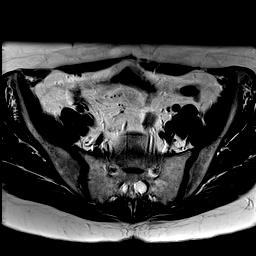
[im 21/26]
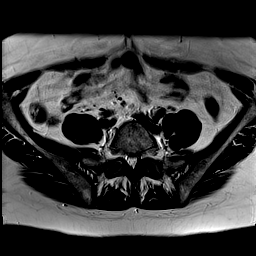
[im 26/26]
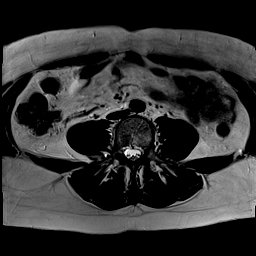

[Series 7: axial spgr · axial · 6.0mm · 0.94mm/px · z∈[-53,+142]mm · 7 of 26 slices shown]
[im 1/26]
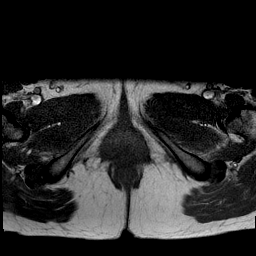
[im 5/26]
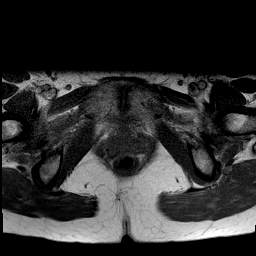
[im 9/26]
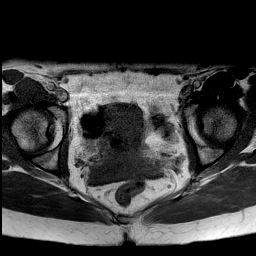
[im 13/26]
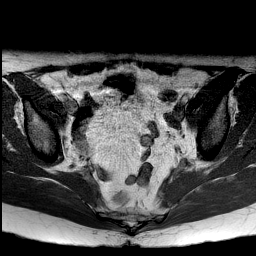
[im 17/26]
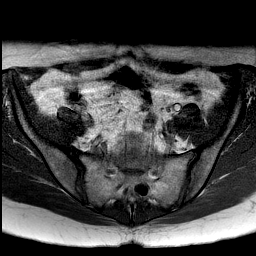
[im 21/26]
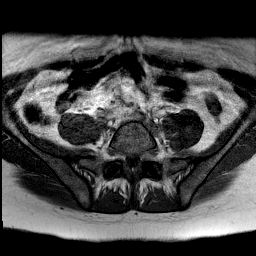
[im 26/26]
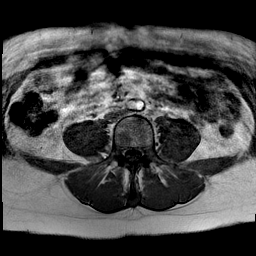

[34 of 48 positions shown; findings below may reference images not displayed]

FINDINGS: COMBINED FINDINGS FOR BOTH MR ABDOMEN AND PELVIS

Lower chest: No acute abnormality.

Hepatobiliary: Cysts in the left lobe of the liver. No solid
enhancing hepatic lesion. No significant hepatic steatosis.
Cholelithiasis without findings of acute cholecystitis. No biliary
ductal dilation.

Pancreas: Intrinsic T1 signal of the pancreatic parenchyma is within
normal limits. Homogeneous enhancement of the pancreas postcontrast
administration. No pancreatic ductal dilation. No cystic or solid
hyperenhancing pancreatic lesion identified.

Spleen:  No splenomegaly or focal splenic lesion.

Adrenals/Urinary Tract: Bilateral adrenal glands appear normal. No
hydronephrosis. 2.6 cm left upper pole renal cyst. No solid
enhancing renal mass.

Stomach/Bowel: Stomach is unremarkable for degree of distension. No
pathologic dilation of small or large bowel. The terminal ileum
appears normal. Appendix is not confidently identified however there
is no pericecal inflammation. No evidence of acute bowel
inflammation. Colonic diverticulosis without findings of acute
diverticulitis.

Vascular/Lymphatic:  No abdominal aortic aneurysm.

Reproductive: Uterus and bilateral adnexa are unremarkable. No
abnormal intrinsically T1 hyperintense foci visualized in the pelvis
to suggest endometrial implants.

Other:  No significant abdominopelvic free fluid.

Musculoskeletal: Sacral perineural root sleeve cysts. No suspicious
osseous lesion.
IMPRESSION: 1. No acute abnormality visualized within the abdomen or pelvis.
2. Colonic diverticulosis without findings of acute diverticulitis.
3. Benign 2.6 cm left upper pole renal cyst.
4. Nonenhancing cysts in the left lobe of the liver.
5. Cholelithiasis without findings of acute cholecystitis.

## 2022-01-18 MED ORDER — GADOBENATE DIMEGLUMINE 529 MG/ML IV SOLN
13.0000 mL | Freq: Once | INTRAVENOUS | Status: AC | PRN
  Administered 2022-01-18: 13 mL via INTRAVENOUS

## 2022-12-19 ENCOUNTER — Other Ambulatory Visit: Payer: Self-pay

## 2022-12-25 ENCOUNTER — Telehealth: Payer: Self-pay

## 2022-12-25 NOTE — Telephone Encounter (Signed)
Mychart msg sent. AS, CMA 

## 2023-01-01 DIAGNOSIS — Z1151 Encounter for screening for human papillomavirus (HPV): Secondary | ICD-10-CM | POA: Diagnosis not present

## 2023-01-01 DIAGNOSIS — Z6823 Body mass index (BMI) 23.0-23.9, adult: Secondary | ICD-10-CM | POA: Diagnosis not present

## 2023-01-01 DIAGNOSIS — Z0000 Encounter for general adult medical examination without abnormal findings: Secondary | ICD-10-CM | POA: Diagnosis not present

## 2023-01-01 DIAGNOSIS — Z01419 Encounter for gynecological examination (general) (routine) without abnormal findings: Secondary | ICD-10-CM | POA: Diagnosis not present

## 2023-01-01 DIAGNOSIS — Z124 Encounter for screening for malignant neoplasm of cervix: Secondary | ICD-10-CM | POA: Diagnosis not present

## 2023-01-06 ENCOUNTER — Other Ambulatory Visit: Payer: Self-pay | Admitting: Obstetrics and Gynecology

## 2023-01-06 DIAGNOSIS — F17200 Nicotine dependence, unspecified, uncomplicated: Secondary | ICD-10-CM

## 2023-02-13 ENCOUNTER — Ambulatory Visit
Admission: RE | Admit: 2023-02-13 | Discharge: 2023-02-13 | Disposition: A | Discharge status: Home / Self Care | Payer: Managed Care, Other (non HMO) | Source: Ambulatory Visit | Attending: Obstetrics and Gynecology | Admitting: Obstetrics and Gynecology

## 2023-02-13 DIAGNOSIS — F17200 Nicotine dependence, unspecified, uncomplicated: Secondary | ICD-10-CM

## 2023-02-17 ENCOUNTER — Other Ambulatory Visit: Payer: Self-pay | Admitting: Obstetrics and Gynecology

## 2023-02-17 ENCOUNTER — Encounter: Payer: Self-pay | Admitting: Obstetrics and Gynecology

## 2023-02-17 DIAGNOSIS — E041 Nontoxic single thyroid nodule: Secondary | ICD-10-CM

## 2023-02-24 ENCOUNTER — Other Ambulatory Visit: Payer: Self-pay | Admitting: Obstetrics and Gynecology

## 2023-02-24 DIAGNOSIS — E041 Nontoxic single thyroid nodule: Secondary | ICD-10-CM

## 2023-03-26 DIAGNOSIS — Z1231 Encounter for screening mammogram for malignant neoplasm of breast: Secondary | ICD-10-CM | POA: Diagnosis not present

## 2023-04-03 ENCOUNTER — Ambulatory Visit
Admission: RE | Admit: 2023-04-03 | Discharge: 2023-04-03 | Disposition: A | Discharge status: Home / Self Care | Payer: No Typology Code available for payment source | Source: Ambulatory Visit | Attending: Obstetrics and Gynecology | Admitting: Obstetrics and Gynecology

## 2023-04-03 ENCOUNTER — Ambulatory Visit
Admission: RE | Admit: 2023-04-03 | Discharge: 2023-04-03 | Disposition: A | Discharge status: Home / Self Care | Payer: Medicaid Other | Source: Ambulatory Visit | Attending: Obstetrics and Gynecology | Admitting: Obstetrics and Gynecology

## 2023-04-03 DIAGNOSIS — E041 Nontoxic single thyroid nodule: Secondary | ICD-10-CM

## 2023-04-03 DIAGNOSIS — E042 Nontoxic multinodular goiter: Secondary | ICD-10-CM | POA: Diagnosis not present

## 2023-06-11 DIAGNOSIS — Z13820 Encounter for screening for osteoporosis: Secondary | ICD-10-CM | POA: Diagnosis not present

## 2023-06-11 DIAGNOSIS — N958 Other specified menopausal and perimenopausal disorders: Secondary | ICD-10-CM | POA: Diagnosis not present

## 2023-07-06 ENCOUNTER — Ambulatory Visit: Payer: Medicaid Other | Admitting: Endocrinology

## 2023-07-06 ENCOUNTER — Encounter: Payer: Self-pay | Admitting: Endocrinology

## 2023-07-06 VITALS — BP 122/80 | HR 69 | Ht 64.5 in | Wt 139.6 lb

## 2023-07-06 DIAGNOSIS — E042 Nontoxic multinodular goiter: Secondary | ICD-10-CM | POA: Diagnosis not present

## 2023-07-06 LAB — TSH: TSH: 0.89 u[IU]/mL (ref 0.35–5.50)

## 2023-07-06 LAB — T4, FREE: Free T4: 0.76 ng/dL (ref 0.60–1.60)

## 2023-07-06 NOTE — Progress Notes (Signed)
Outpatient Endocrinology Note Penny Leilynn Pilat, MD  07/06/23  Patient's Name: Penny Hall    DOB: 24-Nov-1965    MRN: [id number]  REASON OF VISIT: New consult for thyroid nodules   PCP: Patient, No Pcp Per  REFERRING PROVIDER: Richardean Chimera, MD  HISTORY OF PRESENT ILLNESS:   Penny Hall is a 57 y.o. old female with past medical history as listed below is presented for evaluation of thyroid nodules.  Pertinent Thyroid History:  - Patient incidentally found to have thyroid nodules on CT chest for screening in 01/2023. Subsequently had ultrasound in 04/03/2023 which showed right complex nodule measuring 1.83 cm and left 2 solid subcentimeter nodules.  - Patient denies compressive symptoms including dysphagia, shortness of breath, neck pain or voice changes. - Patient denies hypo or hyperthyroid symptoms including heat or cold intolerance, sweating, palpitation, significant weight loss or gain, fatigue, change in bowel movements, hair loss, skin changes. -Patient is at update and not aware about family history. - Patient denies h/o significant radiation exposure or radiation treatment in the neck.  - Thyroid US on : reviewed images.  CLINICAL DATA:  NONTOXIC SINGLE THYROID NODULE   EXAM: THYROID ULTRASOUND   TECHNIQUE: Ultrasound examination of the thyroid gland and adjacent soft tissues was performed.   COMPARISON:  None Available.   FINDINGS: Parenchymal Echotexture: Mildly heterogenous   Isthmus: 0.3 cm   Right lobe: 4.2 x 1.8 x 1.6 cm   Left lobe: 4.6 x 1.6 x 1.0 cm   _________________________________________________________   Estimated total number of nodules >/= 1 cm: 1   Number of spongiform nodules >/=  2 cm not described below (TR1): 0   Number of mixed cystic and solid nodules >/= 1.5 cm not described below (TR2): 0   _________________________________________________________   Nodule labeled 1 is a mixed cystic and solid hypoechoic TR 3  nodule in the right thyroid lobe that measures 1.8 x 1.6 x 1.6 cm. *Given size (>/= 1.5 - 2.4 cm) and appearance, a follow-up ultrasound in 1 year should be considered based on TI-RADS criteria.   Nodule labeled 2 and 3 are both small subcentimeter thyroid nodules in the left thyroid lobe which do not meet criteria for further dedicated follow-up or biopsy.   IMPRESSION: 1. Multinodular thyroid gland. 2. Nodule labeled 1 (1.8 cm TR 3) meets criteria for follow-up ultrasound in 1 year. A total follow-up interval of 5 years is recommended, with this exam serving as a baseline.    Interval history 07/06/23 Patient presented today for evaluation of thyroid nodule.  She had ultrasound thyroid in May 2024 as reviewed above.  REVIEW OF SYSTEMS:  As per history of present illness.   PAST MEDICAL HISTORY: History reviewed. No pertinent past medical history.  PAST SURGICAL HISTORY: History reviewed. No pertinent surgical history.  ALLERGIES: Not on File  FAMILY HISTORY:  History reviewed. No pertinent family history.  SOCIAL HISTORY: Social History   Socioeconomic History   Marital status: Married    Spouse name: Not on file   Number of children: Not on file   Years of education: Not on file   Highest education level: Not on file  Occupational History   Not on file  Tobacco Use   Smoking status: Every Day    Current packs/day: 0.50    Types: Cigarettes   Smokeless tobacco: Never  Substance and Sexual Activity   Alcohol use: Not on file   Drug use: Not on file   Sexual activity:  Not on file  Other Topics Concern   Not on file  Social History Narrative   Not on file   Social Determinants of Health   Financial Resource Strain: Not on file  Food Insecurity: Not on file  Transportation Needs: Not on file  Physical Activity: Not on file  Stress: Not on file  Social Connections: Not on file    MEDICATIONS:  Current Outpatient Medications  Medication Sig Dispense  Refill   bisacodyl (DULCOLAX) 5 MG EC tablet Take 10 mg by mouth once.     Calcium Carbonate-Vitamin D (CALTRATE 600+D PO) Take 1 tablet by mouth daily at 8 pm.     Multiple Vitamin (MULTIVITAMIN WITH MINERALS) TABS tablet Take 1 tablet by mouth daily.     Probiotic Product (ALIGN) 4 MG CAPS Take 1 capsule by mouth daily at 6 (six) AM.     No current facility-administered medications for this visit.    PHYSICAL EXAM: Vitals:   07/06/23 1334  BP: 122/80  Pulse: 69  SpO2: 99%  Weight: 139 lb 9.6 oz (63.3 kg)  Height: 5' 4.5" (1.638 m)   Body mass index is 23.59 kg/m.  @WEIGHT (3)@     General: Well developed, well nourished female in no apparent distress.  HEENT: AT/[place], no external lesions. Hearing intact to the spoken word Eyes: EOMI. No stare, proptosis. Conjunctiva clear and no icterus. Neck: Trachea midline, neck supple without appreciable thyromegaly or lymphadenopathy and no palpable thyroid nodules Lungs: Clear to auscultation, no wheeze. Respirations not labored Heart: S1S2, Regular in rate and rhythm. No loud murmurs Abdomen: Soft, non tender Neurologic: Alert, oriented, normal speech, deep tendon biceps reflexes normal,  no gross focal neurological deficit Extremities: No pedal pitting edema, no tremors of outstretched hands Skin: Warm, color good.  Psychiatric: Does not appear depressed or anxious  PERTINENT HISTORIC LABORATORY AND IMAGING STUDIES:  All pertinent laboratory results were reviewed. Please see HPI also for further details.   No results found for: "TSH", "T3TOTAL"   ASSESSMENT / PLAN  1. Multiple thyroid nodules    - No compressive symptoms, euthyroid, no h/o radiation exposure.  She has incidentally discovered thyroid nodule initially on CT scan of the chest.  Some thyroid in May 2024 showed right complex thyroid nodule measuring 1.83 cm and left solid 2 subcentimeter thyroid nodules.  -No indication of FNA/biopsy of these nodules.  We can monitor  with serial ultrasound.  Plan: -Check thyroid function test today. -Ultrasound thyroid around May 2025.    Thyroid nodule / FNA talk: -Approximately 5% of individuals have palpable thyroid nodules and 30% or more of adults have non-palpable nodules. The prevalence of nodules increases with age, so that perhaps 50% of individuals older than 57 years of age have nodules. Many of these nodules will be well under 1cm in size.  -The incidence of thyroid cancer is low, but rising. The prevalence of thyroid cancer is low compared with the prevalence of thyroid nodule.  -There have been a number of studies that have estimated risk of malignancy in thyroid nodule. The estimated risk varies with size and other characteristics of nodules selected for biopsy, the technique used for the biopsy, the institution and its referral patterns, and the characteristics of the local population. Most studies have estimated malignancy risk in nodules that are palpable or > 1cm on U/S to be in the range of 3-15%.    - In general, options for further evaluation include: - Follow with serial U/S - Fine  needle aspiration biopsy - Thyroidectomy - In general the criteria for FNA biopsy includes:  - All palpable nodules - Generally nodules > 1 and for some nodules up to > 1 to 2 cm, based on other criteria.  - Rarely Nodules > 8-26mm in two or more dimensions with high risk sonographic features, or occuring in patients with high risk historical features. Nodules not meeting the above criteria can generally be followed with serial ultrasounds.   Jakaylee was seen today for establish care.  Diagnoses and all orders for this visit:  Multiple thyroid nodules -     T4, free; Future -     TSH; Future -     US THYROID; Future -     TSH -     T4, free    DISPOSITION Follow up in clinic in June 2025 suggested.  All questions answered and patient verbalized understanding of the plan.  Penny Marlyn Rabine, MD [facility]  Endocrinology [facility] [address] [place], [address] [place], [place] [address] Phone # [phone]  At least part of this note was generated using voice recognition software. Inadvertent word errors may have occurred, which were not recognized during the proofreading process.

## 2023-07-13 DIAGNOSIS — Z0000 Encounter for general adult medical examination without abnormal findings: Secondary | ICD-10-CM | POA: Diagnosis not present

## 2023-07-13 DIAGNOSIS — K5909 Other constipation: Secondary | ICD-10-CM | POA: Diagnosis not present

## 2023-07-13 DIAGNOSIS — L821 Other seborrheic keratosis: Secondary | ICD-10-CM | POA: Diagnosis not present

## 2023-07-13 DIAGNOSIS — Z13220 Encounter for screening for lipoid disorders: Secondary | ICD-10-CM | POA: Diagnosis not present

## 2023-07-13 DIAGNOSIS — E042 Nontoxic multinodular goiter: Secondary | ICD-10-CM | POA: Diagnosis not present

## 2023-07-13 DIAGNOSIS — J0100 Acute maxillary sinusitis, unspecified: Secondary | ICD-10-CM | POA: Diagnosis not present

## 2023-08-20 DIAGNOSIS — Z23 Encounter for immunization: Secondary | ICD-10-CM | POA: Diagnosis not present

## 2024-01-04 DIAGNOSIS — Z1151 Encounter for screening for human papillomavirus (HPV): Secondary | ICD-10-CM | POA: Diagnosis not present

## 2024-01-04 DIAGNOSIS — Z6824 Body mass index (BMI) 24.0-24.9, adult: Secondary | ICD-10-CM | POA: Diagnosis not present

## 2024-01-04 DIAGNOSIS — Z124 Encounter for screening for malignant neoplasm of cervix: Secondary | ICD-10-CM | POA: Diagnosis not present

## 2024-01-04 DIAGNOSIS — Z01419 Encounter for gynecological examination (general) (routine) without abnormal findings: Secondary | ICD-10-CM | POA: Diagnosis not present

## 2024-01-05 ENCOUNTER — Other Ambulatory Visit: Payer: Self-pay | Admitting: Obstetrics and Gynecology

## 2024-01-05 DIAGNOSIS — Z720 Tobacco use: Secondary | ICD-10-CM

## 2024-02-16 ENCOUNTER — Ambulatory Visit
Admission: RE | Admit: 2024-02-16 | Discharge: 2024-02-16 | Disposition: A | Discharge status: Home / Self Care | Payer: Medicaid Other | Source: Ambulatory Visit | Attending: Obstetrics and Gynecology | Admitting: Obstetrics and Gynecology

## 2024-02-16 DIAGNOSIS — Z720 Tobacco use: Secondary | ICD-10-CM

## 2024-02-16 DIAGNOSIS — Z122 Encounter for screening for malignant neoplasm of respiratory organs: Secondary | ICD-10-CM | POA: Diagnosis not present

## 2024-02-16 DIAGNOSIS — F17210 Nicotine dependence, cigarettes, uncomplicated: Secondary | ICD-10-CM | POA: Diagnosis not present

## 2024-03-21 ENCOUNTER — Ambulatory Visit
Admission: RE | Admit: 2024-03-21 | Discharge: 2024-03-21 | Disposition: A | Discharge status: Home / Self Care | Payer: Medicaid Other | Source: Ambulatory Visit | Attending: Endocrinology | Admitting: Endocrinology

## 2024-03-21 DIAGNOSIS — E041 Nontoxic single thyroid nodule: Secondary | ICD-10-CM | POA: Diagnosis not present

## 2024-03-21 DIAGNOSIS — E042 Nontoxic multinodular goiter: Secondary | ICD-10-CM

## 2024-03-28 ENCOUNTER — Encounter: Payer: Self-pay | Admitting: Endocrinology

## 2024-03-31 DIAGNOSIS — Z1231 Encounter for screening mammogram for malignant neoplasm of breast: Secondary | ICD-10-CM | POA: Diagnosis not present

## 2024-04-18 ENCOUNTER — Encounter: Payer: Self-pay | Admitting: Endocrinology

## 2024-04-18 ENCOUNTER — Ambulatory Visit: Payer: Medicaid Other | Admitting: Endocrinology

## 2024-04-18 VITALS — BP 122/70 | HR 65 | Resp 20 | Ht 64.5 in | Wt 145.6 lb

## 2024-04-18 DIAGNOSIS — E041 Nontoxic single thyroid nodule: Secondary | ICD-10-CM

## 2024-04-18 DIAGNOSIS — E042 Nontoxic multinodular goiter: Secondary | ICD-10-CM | POA: Diagnosis not present

## 2024-04-18 NOTE — Patient Instructions (Signed)
 Follow up in 2 years. Spring of 2027, call our office to plan for US  thyroid  and follow up with me.

## 2024-04-18 NOTE — Progress Notes (Signed)
 Outpatient Endocrinology Note [place] Pilar Westergaard, MD  04/18/24  Patient's Name: Penny Hall    DOB: Sep 12, 1966    MRN: [id number]  REASON OF VISIT: Follow-up for thyroid  nodules   PCP: Patient, No Pcp Per  REFERRING PROVIDER: Merryl Abraham, MD  HISTORY OF PRESENT ILLNESS:   Penny Hall is a 58 y.o. old female with past medical history as listed below is presented for follow-up of thyroid  nodules.  Pertinent Thyroid  History:  Initial consult in August 2024 : Patient incidentally found to have thyroid  nodules on CT chest for screening in 01/2023. Subsequently had ultrasound in 04/03/2023 which showed right complex nodule measuring 1.83 cm and left 2 solid subcentimeter nodules.  - Patient denies compressive symptoms including dysphagia, shortness of breath, neck pain or voice changes. - Patient denies hypo or hyperthyroid symptoms including heat or cold intolerance, sweating, palpitation, significant weight loss or gain, fatigue, change in bowel movements, hair loss, skin changes. -Patient is at update and not aware about family history. - Patient denies h/o significant radiation exposure or radiation treatment in the neck.  - Thyroid  US  on : reviewed images.  CLINICAL DATA:  NONTOXIC SINGLE THYROID  NODULE   EXAM: THYROID  ULTRASOUND   TECHNIQUE: Ultrasound examination of the thyroid  gland and adjacent soft tissues was performed.   COMPARISON:  None Available.   FINDINGS: Parenchymal Echotexture: Mildly heterogenous   Isthmus: 0.3 cm   Right lobe: 4.2 x 1.8 x 1.6 cm   Left lobe: 4.6 x 1.6 x 1.0 cm   _________________________________________________________   Estimated total number of nodules >/= 1 cm: 1   Number of spongiform nodules >/=  2 cm not described below (TR1): 0   Number of mixed cystic and solid nodules >/= 1.5 cm not described below (TR2): 0   _________________________________________________________   Nodule labeled 1 is a mixed cystic  and solid hypoechoic TR 3 nodule in the right thyroid  lobe that measures 1.8 x 1.6 x 1.6 cm. *Given size (>/= 1.5 - 2.4 cm) and appearance, a follow-up ultrasound in 1 year should be considered based on TI-RADS criteria.   Nodule labeled 2 and 3 are both small subcentimeter thyroid  nodules in the left thyroid  lobe which do not meet criteria for further dedicated follow-up or biopsy.   IMPRESSION: 1. Multinodular thyroid  gland. 2. Nodule labeled 1 (1.8 cm TR 3) meets criteria for follow-up ultrasound in 1 year. A total follow-up interval of 5 years is recommended, with this exam serving as a baseline.    Interval history  She denies neck discomfort, dysphagia or any neck compressive symptoms.  No hypo and hyperthyroid symptoms.  She is not on thyroid  medication.   Patient had ultrasound thyroid  in the beginning of last month, reviewed images and compared with prior ultrasound.  Right complex thyroid  nodule is measuring 1.4 cm with no worrisome features and has decreased in size.  EXAM: THYROID  ULTRASOUND   TECHNIQUE: Ultrasound examination of the thyroid  gland and adjacent soft tissues was performed.   COMPARISON:  Prior thyroid  ultrasound 04/03/2023   FINDINGS: Parenchymal Echotexture: Mildly heterogenous   Isthmus: 0.2 cm   Right lobe: 4.6 x 1.6 x 1.9 cm   Left lobe: 4.5 x 1.2 x 1.7 cm   _________________________________________________________   Estimated total number of nodules >/= 1 cm: 1   Number of spongiform nodules >/=  2 cm not described below (TR1): 0   Number of mixed cystic and solid nodules >/= 1.5 cm not described below (TR2): 0  _________________________________________________________   Nodule # 1: Decreasing size of mixed cystic and solid nodule in the right mid gland which now measures no more than 1.4 x 1.1 x 1.4 cm compared to 1.8 x 1.6 x 1.6 cm previously. Involution over time is consistent with benignity.   Additional small subcentimeter  nodules noted in the right and left mid gland. These do not meet criteria for further evaluation.   IMPRESSION: Decreasing size of nodule # 1 in the right mid gland compared to prior imaging. Involution over time is consistent with benignity. No further follow-up recommended.   No new nodules or suspicious features.  REVIEW OF SYSTEMS:  As per history of present illness.   PAST MEDICAL HISTORY: History reviewed. No pertinent past medical history.  PAST SURGICAL HISTORY: History reviewed. No pertinent surgical history.  ALLERGIES: Not on File  FAMILY HISTORY:  History reviewed. No pertinent family history.  SOCIAL HISTORY: Social History   Socioeconomic History   Marital status: Married    Spouse name: Not on file   Number of children: Not on file   Years of education: Not on file   Highest education level: Not on file  Occupational History   Not on file  Tobacco Use   Smoking status: Every Day    Current packs/day: 0.50    Types: Cigarettes   Smokeless tobacco: Never  Substance and Sexual Activity   Alcohol use: Not on file   Drug use: Not on file   Sexual activity: Not on file  Other Topics Concern   Not on file  Social History Narrative   Not on file   Social Drivers of Health   Financial Resource Strain: Not on file  Food Insecurity: Not on file  Transportation Needs: Not on file  Physical Activity: Not on file  Stress: Not on file  Social Connections: Not on file    MEDICATIONS:  Current Outpatient Medications  Medication Sig Dispense Refill   bisacodyl (DULCOLAX) 5 MG EC tablet Take 10 mg by mouth once.     Calcium Carbonate-Vitamin D (CALTRATE 600+D PO) Take 1 tablet by mouth daily at 8 pm.     Multiple Vitamin (MULTIVITAMIN WITH MINERALS) TABS tablet Take 1 tablet by mouth daily.     Probiotic Product (ALIGN) 4 MG CAPS Take 1 capsule by mouth daily at 6 (six) AM.     No current facility-administered medications for this visit.    PHYSICAL  EXAM: Vitals:   04/18/24 1305  BP: 122/70  Pulse: 65  Resp: 20  SpO2: 97%  Weight: 145 lb 9.6 oz (66 kg)  Height: 5' 4.5" (1.638 m)    Body mass index is 24.61 kg/m.  @WEIGHT (3)@     General: Well developed, well nourished female in no apparent distress.  HEENT: AT/[place], no external lesions. Hearing intact to the spoken word Eyes: EOMI. No stare, proptosis. Conjunctiva clear and no icterus. Neck: Trachea midline, neck supple without appreciable thyromegaly or lymphadenopathy and no palpable thyroid  nodules Lungs: Clear to auscultation, no wheeze. Respirations not labored Heart: S1S2, Regular in rate and rhythm. No loud murmurs Abdomen: Soft, non tender Neurologic: Alert, oriented, normal speech, deep tendon biceps reflexes normal,  no gross focal neurological deficit Extremities: No pedal pitting edema, no tremors of outstretched hands Skin: Warm, color good.  Psychiatric: Does not appear depressed or anxious  PERTINENT HISTORIC LABORATORY AND IMAGING STUDIES:  All pertinent laboratory results were reviewed. Please see HPI also for further details.   TSH  Date Value Ref Range Status  07/06/2023 0.89 0.35 - 5.50 uIU/mL Final     ASSESSMENT / PLAN  1. Right thyroid  nodule     - No compressive symptoms, euthyroid, no h/o radiation exposure.  She has incidentally discovered thyroid  nodule initially on CT scan of the chest.  Some thyroid  in May 2024 showed right complex thyroid  nodule measuring 1.83 cm and left solid 2 subcentimeter thyroid  nodules.  Repeat ultrasound thyroid  right complex thyroid  nodule measuring 1.4 cm decrease in size.  No worrisome features, reassuring.  -No indication of FNA/biopsy of these nodules.  We can monitor with serial ultrasound.  Plan: -Check thyroid  function test today. - Will plan for ultrasound thyroid  in 2 years. - Follow-up with endocrinology in 2 years, patient is asked to call our clinic in the spring 2027 to plan for follow-up visit and  ultrasound thyroid  prior to follow-up visit.   Diagnoses and all orders for this visit:  Right thyroid  nodule -     T4, free -     TSH     DISPOSITION Follow up in clinic in 2 years suggested.  All questions answered and patient verbalized understanding of the plan.  [place] Elveria Lauderbaugh, MD [facility] Endocrinology [facility] [address] [place], [address] [place], [place] [address] Phone # [phone]  At least part of this note was generated using voice recognition software. Inadvertent word errors may have occurred, which were not recognized during the proofreading process.

## 2024-04-19 ENCOUNTER — Ambulatory Visit: Payer: Self-pay | Admitting: Endocrinology

## 2024-04-19 LAB — T4, FREE: Free T4: 1.2 ng/dL (ref 0.8–1.8)

## 2024-04-19 LAB — TSH: TSH: 0.57 m[IU]/L (ref 0.40–4.50)

## 2024-08-11 DIAGNOSIS — Z0000 Encounter for general adult medical examination without abnormal findings: Secondary | ICD-10-CM | POA: Diagnosis not present

## 2024-08-11 DIAGNOSIS — K5909 Other constipation: Secondary | ICD-10-CM | POA: Diagnosis not present

## 2024-08-11 DIAGNOSIS — Z720 Tobacco use: Secondary | ICD-10-CM | POA: Diagnosis not present

## 2024-08-11 DIAGNOSIS — E042 Nontoxic multinodular goiter: Secondary | ICD-10-CM | POA: Diagnosis not present

## 2024-08-11 DIAGNOSIS — Z23 Encounter for immunization: Secondary | ICD-10-CM | POA: Diagnosis not present

## 2024-08-11 DIAGNOSIS — Z13220 Encounter for screening for lipoid disorders: Secondary | ICD-10-CM | POA: Diagnosis not present

## 2024-08-26 DIAGNOSIS — Z23 Encounter for immunization: Secondary | ICD-10-CM | POA: Diagnosis not present
